# Patient Record
Sex: Female | Born: 1935 | Race: White | Hispanic: No | Marital: Married | Smoking: Never smoker
Health system: Southern US, Community
[De-identification: ages and names within clinical notes are randomized; demographics above are authoritative.]

## PROBLEM LIST (undated history)

## (undated) DIAGNOSIS — T7840XA Allergy, unspecified, initial encounter: Secondary | ICD-10-CM

## (undated) DIAGNOSIS — I10 Essential (primary) hypertension: Secondary | ICD-10-CM

## (undated) HISTORY — DX: Allergy, unspecified, initial encounter: T78.40XA

## (undated) HISTORY — DX: Essential (primary) hypertension: I10

---

## 1968-11-15 HISTORY — PX: CLOSED REDUCTION AND PERCUTANEOUS PINNING OF HUMERUS FRACTURE: SHX1356

## 2005-04-29 ENCOUNTER — Ambulatory Visit: Payer: Self-pay | Admitting: Family Medicine

## 2005-05-04 ENCOUNTER — Ambulatory Visit: Payer: Self-pay | Admitting: Family Medicine

## 2006-06-03 ENCOUNTER — Ambulatory Visit: Payer: Self-pay | Admitting: Family Medicine

## 2007-06-15 ENCOUNTER — Ambulatory Visit: Payer: Self-pay | Admitting: Family Medicine

## 2008-07-03 ENCOUNTER — Ambulatory Visit: Payer: Self-pay | Admitting: Family Medicine

## 2009-07-08 ENCOUNTER — Ambulatory Visit: Payer: Self-pay | Admitting: Anesthesiology

## 2010-07-09 ENCOUNTER — Ambulatory Visit: Payer: Self-pay | Admitting: Anesthesiology

## 2011-08-04 ENCOUNTER — Ambulatory Visit: Payer: Self-pay | Admitting: Anesthesiology

## 2012-02-11 ENCOUNTER — Emergency Department: Payer: Self-pay | Admitting: Emergency Medicine

## 2012-02-11 LAB — COMPREHENSIVE METABOLIC PANEL
Albumin: 3.8 g/dL (ref 3.4–5.0)
Alkaline Phosphatase: 65 U/L (ref 50–136)
Bilirubin,Total: 0.5 mg/dL (ref 0.2–1.0)
Calcium, Total: 8.8 mg/dL (ref 8.5–10.1)
Co2: 24 mmol/L (ref 21–32)
Creatinine: 0.6 mg/dL (ref 0.60–1.30)
Glucose: 102 mg/dL — ABNORMAL HIGH (ref 65–99)
SGPT (ALT): 41 U/L
Total Protein: 7.3 g/dL (ref 6.4–8.2)

## 2012-02-11 LAB — CBC
HCT: 40.8 % (ref 35.0–47.0)
HGB: 13.2 g/dL (ref 12.0–16.0)
MCHC: 32.5 g/dL (ref 32.0–36.0)
MCV: 84 fL (ref 80–100)
RBC: 4.86 10*6/uL (ref 3.80–5.20)
RDW: 14.8 % — ABNORMAL HIGH (ref 11.5–14.5)
WBC: 6.7 10*3/uL (ref 3.6–11.0)

## 2012-03-27 ENCOUNTER — Ambulatory Visit: Payer: Self-pay | Admitting: Family Medicine

## 2012-08-04 ENCOUNTER — Ambulatory Visit: Payer: Self-pay

## 2013-08-08 ENCOUNTER — Ambulatory Visit: Payer: Self-pay | Admitting: Family Medicine

## 2013-10-31 ENCOUNTER — Ambulatory Visit: Payer: Self-pay | Admitting: Family Medicine

## 2014-02-01 ENCOUNTER — Ambulatory Visit: Payer: Self-pay | Admitting: Family Medicine

## 2014-05-23 ENCOUNTER — Ambulatory Visit: Payer: Self-pay | Admitting: Family Medicine

## 2014-08-23 ENCOUNTER — Ambulatory Visit: Payer: Self-pay | Admitting: Family Medicine

## 2014-11-22 DIAGNOSIS — T8389XD Other specified complication of genitourinary prosthetic devices, implants and grafts, subsequent encounter: Secondary | ICD-10-CM | POA: Diagnosis not present

## 2014-11-22 DIAGNOSIS — N8111 Cystocele, midline: Secondary | ICD-10-CM | POA: Diagnosis not present

## 2014-11-22 DIAGNOSIS — N898 Other specified noninflammatory disorders of vagina: Secondary | ICD-10-CM | POA: Diagnosis not present

## 2014-12-24 DIAGNOSIS — N390 Urinary tract infection, site not specified: Secondary | ICD-10-CM | POA: Diagnosis not present

## 2014-12-24 DIAGNOSIS — N8111 Cystocele, midline: Secondary | ICD-10-CM | POA: Diagnosis not present

## 2014-12-24 DIAGNOSIS — Z79899 Other long term (current) drug therapy: Secondary | ICD-10-CM | POA: Diagnosis not present

## 2014-12-24 DIAGNOSIS — N8112 Cystocele, lateral: Secondary | ICD-10-CM | POA: Diagnosis not present

## 2014-12-24 DIAGNOSIS — N3946 Mixed incontinence: Secondary | ICD-10-CM | POA: Diagnosis not present

## 2014-12-31 DIAGNOSIS — N3946 Mixed incontinence: Secondary | ICD-10-CM | POA: Diagnosis not present

## 2015-01-03 DIAGNOSIS — N812 Incomplete uterovaginal prolapse: Secondary | ICD-10-CM | POA: Diagnosis not present

## 2015-01-03 DIAGNOSIS — R339 Retention of urine, unspecified: Secondary | ICD-10-CM | POA: Diagnosis not present

## 2015-01-07 ENCOUNTER — Ambulatory Visit: Payer: Self-pay | Admitting: Family Medicine

## 2015-01-07 DIAGNOSIS — Z01818 Encounter for other preprocedural examination: Secondary | ICD-10-CM | POA: Diagnosis not present

## 2015-01-07 DIAGNOSIS — J40 Bronchitis, not specified as acute or chronic: Secondary | ICD-10-CM | POA: Diagnosis not present

## 2015-01-07 DIAGNOSIS — I10 Essential (primary) hypertension: Secondary | ICD-10-CM | POA: Diagnosis not present

## 2015-02-04 DIAGNOSIS — I10 Essential (primary) hypertension: Secondary | ICD-10-CM | POA: Diagnosis not present

## 2015-02-28 DIAGNOSIS — M1990 Unspecified osteoarthritis, unspecified site: Secondary | ICD-10-CM | POA: Diagnosis not present

## 2015-02-28 DIAGNOSIS — I10 Essential (primary) hypertension: Secondary | ICD-10-CM | POA: Diagnosis not present

## 2015-02-28 DIAGNOSIS — N3281 Overactive bladder: Secondary | ICD-10-CM | POA: Diagnosis not present

## 2015-02-28 DIAGNOSIS — N819 Female genital prolapse, unspecified: Secondary | ICD-10-CM | POA: Diagnosis not present

## 2015-02-28 DIAGNOSIS — N393 Stress incontinence (female) (male): Secondary | ICD-10-CM | POA: Diagnosis not present

## 2015-02-28 DIAGNOSIS — E785 Hyperlipidemia, unspecified: Secondary | ICD-10-CM | POA: Diagnosis not present

## 2015-03-10 DIAGNOSIS — M1990 Unspecified osteoarthritis, unspecified site: Secondary | ICD-10-CM | POA: Diagnosis not present

## 2015-03-10 DIAGNOSIS — Z79899 Other long term (current) drug therapy: Secondary | ICD-10-CM | POA: Diagnosis not present

## 2015-03-10 DIAGNOSIS — N3946 Mixed incontinence: Secondary | ICD-10-CM | POA: Diagnosis not present

## 2015-03-10 DIAGNOSIS — Z7982 Long term (current) use of aspirin: Secondary | ICD-10-CM | POA: Diagnosis not present

## 2015-03-10 DIAGNOSIS — N814 Uterovaginal prolapse, unspecified: Secondary | ICD-10-CM | POA: Diagnosis not present

## 2015-03-10 DIAGNOSIS — N8110 Cystocele, unspecified: Secondary | ICD-10-CM | POA: Insufficient documentation

## 2015-03-10 DIAGNOSIS — E785 Hyperlipidemia, unspecified: Secondary | ICD-10-CM | POA: Diagnosis not present

## 2015-03-10 DIAGNOSIS — I10 Essential (primary) hypertension: Secondary | ICD-10-CM | POA: Diagnosis not present

## 2015-03-10 DIAGNOSIS — N812 Incomplete uterovaginal prolapse: Secondary | ICD-10-CM | POA: Diagnosis not present

## 2015-03-11 DIAGNOSIS — Z7982 Long term (current) use of aspirin: Secondary | ICD-10-CM | POA: Diagnosis not present

## 2015-03-11 DIAGNOSIS — N3946 Mixed incontinence: Secondary | ICD-10-CM | POA: Diagnosis not present

## 2015-03-11 DIAGNOSIS — N814 Uterovaginal prolapse, unspecified: Secondary | ICD-10-CM | POA: Diagnosis not present

## 2015-03-11 DIAGNOSIS — I10 Essential (primary) hypertension: Secondary | ICD-10-CM | POA: Diagnosis not present

## 2015-03-11 DIAGNOSIS — Z79899 Other long term (current) drug therapy: Secondary | ICD-10-CM | POA: Diagnosis not present

## 2015-03-11 DIAGNOSIS — M1990 Unspecified osteoarthritis, unspecified site: Secondary | ICD-10-CM | POA: Diagnosis not present

## 2015-03-11 DIAGNOSIS — E785 Hyperlipidemia, unspecified: Secondary | ICD-10-CM | POA: Diagnosis not present

## 2015-04-02 DIAGNOSIS — R5383 Other fatigue: Secondary | ICD-10-CM | POA: Diagnosis not present

## 2015-07-01 ENCOUNTER — Ambulatory Visit (INDEPENDENT_AMBULATORY_CARE_PROVIDER_SITE_OTHER): Payer: Medicare Other | Admitting: Family Medicine

## 2015-07-01 ENCOUNTER — Encounter: Payer: Self-pay | Admitting: Family Medicine

## 2015-07-01 VITALS — BP 140/77 | HR 65 | Temp 97.5°F | Resp 18 | Ht 59.0 in | Wt 170.1 lb

## 2015-07-01 DIAGNOSIS — I10 Essential (primary) hypertension: Secondary | ICD-10-CM

## 2015-07-01 DIAGNOSIS — R32 Unspecified urinary incontinence: Secondary | ICD-10-CM | POA: Insufficient documentation

## 2015-07-01 DIAGNOSIS — E785 Hyperlipidemia, unspecified: Secondary | ICD-10-CM | POA: Diagnosis not present

## 2015-07-01 MED ORDER — ATENOLOL 100 MG PO TABS: 100.0000 mg | ORAL_TABLET | Freq: Every day | ORAL | Status: DC

## 2015-07-01 MED ORDER — HYDROCHLOROTHIAZIDE 25 MG PO TABS: 25.0000 mg | ORAL_TABLET | Freq: Every day | ORAL | Status: DC

## 2015-07-01 NOTE — Progress Notes (Signed)
Name: Amanda Jacobs   MRN: [id number]    DOB: 09-23-1931   Date:07/01/2015       Progress Note  Subjective  Chief Complaint  Chief Complaint  Patient presents with  . Follow-up    3 mo./ possible kidney infection    Hypertension This is a chronic problem. The problem is controlled. Pertinent negatives include no blurred vision, chest pain, headaches, palpitations or shortness of breath. Past treatments include beta blockers, calcium channel blockers and diuretics. There is no history of kidney disease, CAD/MI or CVA.    Past Medical History  Diagnosis Date  . Allergy   . Hypertension     Past Surgical History  Procedure Laterality Date  . Closed reduction and percutaneous pinning of humerus fracture  1970    hip    Family History  Problem Relation Age of Onset  . Hypertension Sister   . Diabetes Sister   . Hypertension Brother   . Heart disease Brother   . Cancer Maternal Aunt     colon  . Diabetes Maternal Grandmother     Social History   Social History  . Marital Status: Married    Spouse Name: N/A  . Number of Children: N/A  . Years of Education: N/A   Occupational History  . Not on file.   Social History Main Topics  . Smoking status: Never Smoker   . Smokeless tobacco: Never Used  . Alcohol Use: No  . Drug Use: No  . Sexual Activity: No   Other Topics Concern  . Not on file   Social History Narrative  . No narrative on file     Current outpatient prescriptions:  .  amLODipine (NORVASC) 10 MG tablet, , Disp: , Rfl:  .  aspirin EC 81 MG tablet, Take 81 mg by mouth., Disp: , Rfl:  .  atenolol (TENORMIN) 100 MG tablet, , Disp: , Rfl:  .  Calcium Carb-Cholecalciferol (CALTRATE 600+D) 600-800 MG-UNIT TABS, Take by mouth., Disp: , Rfl:  .  conjugated estrogens (PREMARIN) vaginal cream, Frequency:PHARMDIR   Dosage:0.0     Instructions:  Note:twice weekly (Sunday and Wednesday) Dose: 0.625MG /G, Disp: , Rfl:  .  cyclobenzaprine (FLEXERIL) 5 MG  tablet, Take 5 mg by mouth., Disp: , Rfl:  .  docusate sodium (COLACE) 100 MG capsule, Take 100 mg by mouth., Disp: , Rfl:  .  hydrochlorothiazide (HYDRODIURIL) 25 MG tablet, , Disp: , Rfl:  .  meloxicam (MOBIC) 7.5 MG tablet, , Disp: , Rfl:  .  Omega-3 Fatty Acids (FISH OIL CONCENTRATE) 300 MG CAPS, Take by mouth., Disp: , Rfl:  .  oxybutynin (DITROPAN-XL) 10 MG 24 hr tablet, , Disp: , Rfl:  .  oxyCODONE-acetaminophen (PERCOCET/ROXICET) 5-325 MG per tablet, Take by mouth., Disp: , Rfl:  .  pyridOXINE (VITAMIN B-6) 50 MG tablet, Take 50 mg by mouth., Disp: , Rfl:   Allergies  Allergen Reactions  . Ace Inhibitors     Angioedema  . Azor  [Amlodipine-Olmesartan] Swelling  . Benazepril Cough     Review of Systems  Eyes: Negative for blurred vision.  Respiratory: Negative for shortness of breath.   Cardiovascular: Negative for chest pain and palpitations.  Neurological: Negative for headaches.      Objective  Filed Vitals:   07/01/15 1119  BP: 140/77  Pulse: 65  Temp: 97.5 F (36.4 C)  TempSrc: Oral  Resp: 18  Height: 4\' 11"  (1.499 m)  Weight: 170 lb 1.6 oz (77.157 kg)  SpO2:  93%    Physical Exam  Constitutional: She is well-developed, well-nourished, and in no distress.  Cardiovascular: Normal rate and regular rhythm.   Pulmonary/Chest: Effort normal and breath sounds normal.  Nursing note and vitals reviewed.    Assessment & Plan  1. Essential hypertension  - atenolol (TENORMIN) 100 MG tablet; Take 1 tablet (100 mg total) by mouth daily.  Dispense: 90 tablet; Refill: 0 - hydrochlorothiazide (HYDRODIURIL) 25 MG tablet; Take 1 tablet (25 mg total) by mouth daily.  Dispense: 90 tablet; Refill: 0  2. Dyslipidemia  - Lipid Profile   Drishti Pepperman [name] 07/01/2015 11:34 AM

## 2015-07-02 LAB — LIPID PANEL
Chol/HDL Ratio: 3.8 ratio units (ref 0.0–4.4)
Cholesterol, Total: 168 mg/dL (ref 100–199)
HDL: 44 mg/dL (ref >39)
LDL CALC: 108 mg/dL — AB (ref 0–99)
Triglycerides: 78 mg/dL (ref 0–149)
VLDL CHOLESTEROL CAL: 16 mg/dL (ref 5–40)

## 2015-08-07 ENCOUNTER — Ambulatory Visit (INDEPENDENT_AMBULATORY_CARE_PROVIDER_SITE_OTHER): Payer: Medicare Other

## 2015-08-07 DIAGNOSIS — Z23 Encounter for immunization: Secondary | ICD-10-CM | POA: Diagnosis not present

## 2015-09-09 ENCOUNTER — Other Ambulatory Visit: Payer: Self-pay

## 2015-09-09 MED ORDER — AMLODIPINE BESYLATE 10 MG PO TABS: 10.0000 mg | ORAL_TABLET | Freq: Every day | ORAL | Status: DC

## 2015-09-09 NOTE — Telephone Encounter (Signed)
Patient requesting refill. 

## 2015-10-07 ENCOUNTER — Other Ambulatory Visit: Payer: Self-pay | Admitting: Family Medicine

## 2015-10-07 DIAGNOSIS — M1710 Unilateral primary osteoarthritis, unspecified knee: Secondary | ICD-10-CM

## 2015-10-07 DIAGNOSIS — I10 Essential (primary) hypertension: Secondary | ICD-10-CM

## 2015-10-07 NOTE — Telephone Encounter (Signed)
Error, chart opened by error

## 2016-01-06 ENCOUNTER — Other Ambulatory Visit: Payer: Self-pay | Admitting: Family Medicine

## 2016-01-06 ENCOUNTER — Telehealth: Payer: Self-pay | Admitting: Family Medicine

## 2016-01-06 DIAGNOSIS — I10 Essential (primary) hypertension: Secondary | ICD-10-CM

## 2016-01-06 MED ORDER — AMLODIPINE BESYLATE 10 MG PO TABS: 10.0000 mg | ORAL_TABLET | Freq: Every day | ORAL | Status: DC

## 2016-01-06 NOTE — Telephone Encounter (Signed)
Medication has been refilled and sent to [facility] °

## 2016-01-06 NOTE — Telephone Encounter (Signed)
HCTZ- BLOOD PRESSURE  ATENOLOL- BLOOD PRESSURE The pt is out of the medications per the pharmacy. Said that they have sent a request by fax also.

## 2016-01-15 ENCOUNTER — Ambulatory Visit: Payer: Medicare Other | Admitting: Family Medicine

## 2016-01-21 ENCOUNTER — Ambulatory Visit (INDEPENDENT_AMBULATORY_CARE_PROVIDER_SITE_OTHER): Payer: Medicare Other | Admitting: Family Medicine

## 2016-01-21 ENCOUNTER — Encounter: Payer: Self-pay | Admitting: Family Medicine

## 2016-01-21 VITALS — BP 128/72 | HR 60 | Temp 97.9°F | Resp 15 | Ht 59.0 in | Wt 162.2 lb

## 2016-01-21 DIAGNOSIS — I10 Essential (primary) hypertension: Secondary | ICD-10-CM

## 2016-01-21 MED ORDER — HYDROCHLOROTHIAZIDE 25 MG PO TABS: 25.0000 mg | ORAL_TABLET | Freq: Every day | ORAL | Status: DC

## 2016-01-21 MED ORDER — AMLODIPINE BESYLATE 10 MG PO TABS: 10.0000 mg | ORAL_TABLET | Freq: Every day | ORAL | Status: DC

## 2016-01-21 MED ORDER — ATENOLOL 100 MG PO TABS: 100.0000 mg | ORAL_TABLET | Freq: Every day | ORAL | Status: DC

## 2016-01-21 NOTE — Progress Notes (Signed)
Name: Amanda Jacobs   MRN: [id number]    DOB: Jun 10, 1931   Date:01/21/2016       Progress Note  Subjective  Chief Complaint  Chief Complaint  Patient presents with  . Medication Refill    Hypertension This is a chronic problem. The problem is controlled. Pertinent negatives include no chest pain, headaches, palpitations or shortness of breath. Past treatments include calcium channel blockers, diuretics and beta blockers. There is no history of kidney disease, CAD/MI or CVA.    Past Medical History  Diagnosis Date  . Allergy   . Hypertension     Past Surgical History  Procedure Laterality Date  . Closed reduction and percutaneous pinning of humerus fracture  1970    hip    Family History  Problem Relation Age of Onset  . Hypertension Sister   . Diabetes Sister   . Hypertension Brother   . Heart disease Brother   . Cancer Maternal Aunt     colon  . Diabetes Maternal Grandmother     Social History   Social History  . Marital Status: Married    Spouse Name: N/A  . Number of Children: N/A  . Years of Education: N/A   Occupational History  . Not on file.   Social History Main Topics  . Smoking status: Never Smoker   . Smokeless tobacco: Never Used  . Alcohol Use: No  . Drug Use: No  . Sexual Activity: No   Other Topics Concern  . Not on file   Social History Narrative     Current outpatient prescriptions:  .  amLODipine (NORVASC) 10 MG tablet, Take 1 tablet (10 mg total) by mouth daily., Disp: 30 tablet, Rfl: 0 .  aspirin EC 81 MG tablet, Take 81 mg by mouth., Disp: , Rfl:  .  atenolol (TENORMIN) 100 MG tablet, Take 1 tablet (100 mg total) by mouth daily., Disp: 30 tablet, Rfl: 0 .  Calcium Carb-Cholecalciferol (CALTRATE 600+D) 600-800 MG-UNIT TABS, Take by mouth., Disp: , Rfl:  .  conjugated estrogens (PREMARIN) vaginal cream, Frequency:PHARMDIR   Dosage:0.0     Instructions:  Note:twice weekly (Sunday and Wednesday) Dose: 0.625MG /G, Disp: , Rfl:  .   cyclobenzaprine (FLEXERIL) 5 MG tablet, Take 5 mg by mouth., Disp: , Rfl:  .  docusate sodium (COLACE) 100 MG capsule, Take 100 mg by mouth., Disp: , Rfl:  .  hydrochlorothiazide (HYDRODIURIL) 25 MG tablet, Take 1 tablet (25 mg total) by mouth daily., Disp: 30 tablet, Rfl: 0 .  meloxicam (MOBIC) 7.5 MG tablet, TAKE ONE TABLET BY MOUTH TWICE DAILY WITH FOOD., Disp: 180 tablet, Rfl: 2 .  Omega-3 Fatty Acids (FISH OIL CONCENTRATE) 300 MG CAPS, Take by mouth., Disp: , Rfl:  .  oxybutynin (DITROPAN-XL) 10 MG 24 hr tablet, , Disp: , Rfl:  .  oxyCODONE-acetaminophen (PERCOCET/ROXICET) 5-325 MG per tablet, Take by mouth., Disp: , Rfl:  .  pyridOXINE (VITAMIN B-6) 50 MG tablet, Take 50 mg by mouth., Disp: , Rfl:   Allergies  Allergen Reactions  . Ace Inhibitors     Angioedema  . Azor  [Amlodipine-Olmesartan] Swelling  . Benazepril Cough     Review of Systems  Respiratory: Negative for shortness of breath.   Cardiovascular: Negative for chest pain and palpitations.  Neurological: Negative for headaches.    Objective  Filed Vitals:   01/21/16 1333  BP: 128/72  Pulse: 60  Temp: 97.9 F (36.6 C)  TempSrc: Oral  Resp: 15  Height: 4'  11" (1.499 m)  Weight: 162 lb 3.2 oz (73.573 kg)  SpO2: 97%    Physical Exam  Constitutional: She is oriented to person, place, and time and well-developed, well-nourished, and in no distress.  HENT:  Head: Normocephalic and atraumatic.  Cardiovascular: Normal rate and regular rhythm.   Pulmonary/Chest: Effort normal and breath sounds normal.  Musculoskeletal:       Right ankle: She exhibits no swelling. Tenderness.       Left ankle: She exhibits no swelling. Tenderness.  Neurological: She is alert and oriented to person, place, and time.  Nursing note and vitals reviewed.      Assessment & Plan  1. Essential hypertension BP stable and controlled on present therapy. - amLODipine (NORVASC) 10 MG tablet; Take 1 tablet (10 mg total) by mouth  daily.  Dispense: 30 tablet; Refill: 5 - atenolol (TENORMIN) 100 MG tablet; Take 1 tablet (100 mg total) by mouth daily.  Dispense: 30 tablet; Refill: 5 - hydrochlorothiazide (HYDRODIURIL) 25 MG tablet; Take 1 tablet (25 mg total) by mouth daily.  Dispense: 30 tablet; Refill: 5   Amanda Jacobs [name] 01/21/2016 1:43 PM

## 2016-07-08 ENCOUNTER — Other Ambulatory Visit: Payer: Self-pay | Admitting: Family Medicine

## 2016-07-08 DIAGNOSIS — I10 Essential (primary) hypertension: Secondary | ICD-10-CM

## 2016-07-29 ENCOUNTER — Ambulatory Visit (INDEPENDENT_AMBULATORY_CARE_PROVIDER_SITE_OTHER): Payer: Medicare Other

## 2016-07-29 DIAGNOSIS — Z23 Encounter for immunization: Secondary | ICD-10-CM

## 2016-08-06 ENCOUNTER — Other Ambulatory Visit: Payer: Self-pay | Admitting: Family Medicine

## 2016-08-06 DIAGNOSIS — I10 Essential (primary) hypertension: Secondary | ICD-10-CM

## 2016-08-10 ENCOUNTER — Encounter: Payer: Self-pay | Admitting: Family Medicine

## 2016-08-10 ENCOUNTER — Ambulatory Visit (INDEPENDENT_AMBULATORY_CARE_PROVIDER_SITE_OTHER): Payer: Medicare Other | Admitting: Family Medicine

## 2016-08-10 VITALS — BP 136/71 | HR 113 | Temp 97.6°F | Resp 17 | Wt 150.6 lb

## 2016-08-10 DIAGNOSIS — I10 Essential (primary) hypertension: Secondary | ICD-10-CM

## 2016-08-10 DIAGNOSIS — E785 Hyperlipidemia, unspecified: Secondary | ICD-10-CM

## 2016-08-10 MED ORDER — AMLODIPINE BESYLATE 10 MG PO TABS: 10.0000 mg | ORAL_TABLET | Freq: Every day | ORAL | 5 refills | Status: DC

## 2016-08-10 MED ORDER — ATENOLOL 100 MG PO TABS
100.0000 mg | ORAL_TABLET | Freq: Every day | ORAL | 5 refills | Status: DC
Start: 2016-08-10 — End: 2017-08-23

## 2016-08-10 MED ORDER — HYDROCHLOROTHIAZIDE 25 MG PO TABS: 25.0000 mg | ORAL_TABLET | Freq: Every day | ORAL | 5 refills | Status: DC

## 2016-08-10 NOTE — Progress Notes (Signed)
Name: Amanda Jacobs   MRN: [id number]    DOB: 1931/09/29   Date:08/10/2016       Progress Note  Subjective  Chief Complaint  Chief Complaint  Patient presents with  . Medication Refill    Hypertension  This is a chronic problem. The problem is unchanged. The problem is controlled. Pertinent negatives include no blurred vision, chest pain, headaches, palpitations or shortness of breath. Past treatments include beta blockers, calcium channel blockers and diuretics. There is no history of kidney disease, CAD/MI or CVA.  Hyperlipidemia  This is a chronic problem. The problem is uncontrolled. Recent lipid tests were reviewed and are high. Pertinent negatives include no chest pain or shortness of breath. She is currently on no antihyperlipidemic treatment.    Past Medical History:  Diagnosis Date  . Allergy   . Hypertension     Past Surgical History:  Procedure Laterality Date  . CLOSED REDUCTION AND PERCUTANEOUS PINNING OF HUMERUS FRACTURE  1970   hip    Family History  Problem Relation Age of Onset  . Hypertension Sister   . Diabetes Sister   . Hypertension Brother   . Heart disease Brother   . Cancer Maternal Aunt     colon  . Diabetes Maternal Grandmother     Social History   Social History  . Marital status: Married    Spouse name: N/A  . Number of children: N/A  . Years of education: N/A   Occupational History  . Not on file.   Social History Main Topics  . Smoking status: Never Smoker  . Smokeless tobacco: Never Used  . Alcohol use No  . Drug use: No  . Sexual activity: No   Other Topics Concern  . Not on file   Social History Narrative  . No narrative on file     Current Outpatient Prescriptions:  .  amLODipine (NORVASC) 10 MG tablet, Take 1 tablet (10 mg total) by mouth daily., Disp: 30 tablet, Rfl: 5 .  aspirin EC 81 MG tablet, Take 81 mg by mouth., Disp: , Rfl:  .  atenolol (TENORMIN) 100 MG tablet, Take 1 tablet (100 mg total) by mouth  daily., Disp: 30 tablet, Rfl: 5 .  Calcium Carb-Cholecalciferol (CALTRATE 600+D) 600-800 MG-UNIT TABS, Take by mouth., Disp: , Rfl:  .  conjugated estrogens (PREMARIN) vaginal cream, Frequency:PHARMDIR   Dosage:0.0     Instructions:  Note:twice weekly (Sunday and Wednesday) Dose: 0.625MG /G, Disp: , Rfl:  .  cyclobenzaprine (FLEXERIL) 5 MG tablet, Take 5 mg by mouth., Disp: , Rfl:  .  hydrochlorothiazide (HYDRODIURIL) 25 MG tablet, Take 1 tablet (25 mg total) by mouth daily., Disp: 30 tablet, Rfl: 5 .  meloxicam (MOBIC) 7.5 MG tablet, TAKE ONE TABLET BY MOUTH TWICE DAILY WITH FOOD., Disp: 180 tablet, Rfl: 2 .  Omega-3 Fatty Acids (FISH OIL CONCENTRATE) 300 MG CAPS, Take by mouth., Disp: , Rfl:  .  oxybutynin (DITROPAN-XL) 10 MG 24 hr tablet, , Disp: , Rfl:  .  oxyCODONE-acetaminophen (PERCOCET/ROXICET) 5-325 MG per tablet, Take by mouth., Disp: , Rfl:  .  pyridOXINE (VITAMIN B-6) 50 MG tablet, Take 50 mg by mouth., Disp: , Rfl:   Allergies  Allergen Reactions  . Ace Inhibitors     Angioedema  . Azor  [Amlodipine-Olmesartan] Swelling  . Benazepril Cough     Review of Systems  Eyes: Negative for blurred vision.  Respiratory: Negative for shortness of breath.   Cardiovascular: Negative for chest pain and palpitations.  Neurological: Negative for headaches.    Objective  Vitals:   08/10/16 1400  BP: 136/71  Pulse: (!) 113  Resp: 17  Temp: 97.6 F (36.4 C)  TempSrc: Oral  SpO2: 93%  Weight: 150 lb 9.6 oz (68.3 kg)    Physical Exam  Constitutional: She is well-developed, well-nourished, and in no distress.  HENT:  Head: Normocephalic and atraumatic.  Cardiovascular: Normal rate, regular rhythm, S1 normal and S2 normal.   No murmur heard. Pulmonary/Chest: Effort normal and breath sounds normal. She has no wheezes.  Musculoskeletal:       Right ankle: She exhibits no swelling.       Left ankle: She exhibits no swelling.  Psychiatric: Mood, memory, affect and judgment normal.   Nursing note and vitals reviewed.      Assessment & Plan  1. Essential hypertension BP stable and controlled on present antihypertensive therapy - amLODipine (NORVASC) 10 MG tablet; Take 1 tablet (10 mg total) by mouth daily.  Dispense: 30 tablet; Refill: 5 - atenolol (TENORMIN) 100 MG tablet; Take 1 tablet (100 mg total) by mouth daily.  Dispense: 30 tablet; Refill: 5 - hydrochlorothiazide (HYDRODIURIL) 25 MG tablet; Take 1 tablet (25 mg total) by mouth daily.  Dispense: 30 tablet; Refill: 5  2. Dyslipidemia  - Lipid Profile   Trenden Hazelrigg [facility]ad [facility] 08/10/2016 2:14 PM

## 2016-08-11 ENCOUNTER — Ambulatory Visit: Payer: Medicare Other | Admitting: Family Medicine

## 2016-08-30 ENCOUNTER — Telehealth: Payer: Self-pay | Admitting: Family Medicine

## 2016-08-30 NOTE — Telephone Encounter (Signed)
Called Pt to schedule AWV with [facility] for 10/30 - knb

## 2016-12-30 ENCOUNTER — Telehealth: Payer: Self-pay | Admitting: Family Medicine

## 2016-12-30 ENCOUNTER — Other Ambulatory Visit: Payer: Self-pay | Admitting: Family Medicine

## 2016-12-30 DIAGNOSIS — I10 Essential (primary) hypertension: Secondary | ICD-10-CM

## 2016-12-30 DIAGNOSIS — M1710 Unilateral primary osteoarthritis, unspecified knee: Secondary | ICD-10-CM

## 2016-12-30 MED ORDER — HYDROCHLOROTHIAZIDE 25 MG PO TABS: 25.0000 mg | ORAL_TABLET | Freq: Every day | ORAL | 0 refills | Status: DC

## 2016-12-30 NOTE — Telephone Encounter (Signed)
Prescription for hydrochlorothiazide has been sent to patient's pharmacy

## 2016-12-30 NOTE — Telephone Encounter (Signed)
Pt completely out of hydrochlorothiazide 25mg . [facility]

## 2016-12-31 NOTE — Telephone Encounter (Signed)
Pt informed

## 2017-01-31 ENCOUNTER — Ambulatory Visit: Payer: Medicare Other | Admitting: Family Medicine

## 2017-01-31 ENCOUNTER — Ambulatory Visit (INDEPENDENT_AMBULATORY_CARE_PROVIDER_SITE_OTHER): Payer: Medicare Other

## 2017-01-31 VITALS — BP 132/62 | HR 80 | Temp 97.2°F | Ht 59.0 in | Wt 144.8 lb

## 2017-01-31 DIAGNOSIS — Z0000 Encounter for general adult medical examination without abnormal findings: Secondary | ICD-10-CM

## 2017-01-31 DIAGNOSIS — Z1211 Encounter for screening for malignant neoplasm of colon: Secondary | ICD-10-CM

## 2017-01-31 DIAGNOSIS — Z1239 Encounter for other screening for malignant neoplasm of breast: Secondary | ICD-10-CM | POA: Insufficient documentation

## 2017-01-31 DIAGNOSIS — Z780 Asymptomatic menopausal state: Secondary | ICD-10-CM | POA: Insufficient documentation
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
## 2017-01-31 NOTE — Patient Instructions (Signed)
Amanda Jacobs , Thank you for taking time to come for your Medicare Wellness Visit. I appreciate your ongoing commitment to your health goals. Please review the following plan we discussed and let me know if I can assist you in the future.   Screening recommendations/referrals: Colonoscopy- N/A Mammogram- N/A Bone Density- Denied referral Recommended yearly ophthalmology/optometry visit for glaucoma screening and checkup Recommended yearly dental visit for hygiene and checkup  Vaccinations: Influenza vaccine- done 07/29/16 Pneumococcal vaccine- only have record for Pneumovax 23, Prevnar not present. Pt to check with pharmacy about receiving this.  Tdap vaccine- done 09/02/14   Advanced directives: Declined   Next appointment: None   Preventive Care 65 Years and Older, Female Preventive care refers to lifestyle choices and visits with your health care provider that can promote health and wellness. What does preventive care include?  A yearly physical exam. This is also called an annual well check.  Dental exams once or twice a year.  Routine eye exams. Ask your health care provider how often you should have your eyes checked.  Personal lifestyle choices, including:  Daily care of your teeth and gums.  Regular physical activity.  Eating a healthy diet.  Avoiding tobacco and drug use.  Limiting alcohol use.  Practicing safe sex.  Taking low-dose aspirin every day.  Taking vitamin and mineral supplements as recommended by your health care provider. What happens during an annual well check? The services and screenings done by your health care provider during your annual well check will depend on your age, overall health, lifestyle risk factors, and family history of disease. Counseling  Your health care provider may ask you questions about your:  Alcohol use.  Tobacco use.  Drug use.  Emotional well-being.  Home and relationship well-being.  Sexual  activity.  Eating habits.  History of falls.  Memory and ability to understand (cognition).  Work and work Astronomerenvironment.  Reproductive health. Screening  You may have the following tests or measurements:  Height, weight, and BMI.  Blood pressure.  Lipid and cholesterol levels. These may be checked every 5 years, or more frequently if you are over 81 years old.  Skin check.  Lung cancer screening. You may have this screening every year starting at age 81 if you have a 30-pack-year history of smoking and currently smoke or have quit within the past 15 years.  Fecal occult blood test (FOBT) of the stool. You may have this test every year starting at age 81.  Flexible sigmoidoscopy or colonoscopy. You may have a sigmoidoscopy every 5 years or a colonoscopy every 10 years starting at age 81.  Hepatitis C blood test.  Hepatitis B blood test.  Sexually transmitted disease (STD) testing.  Diabetes screening. This is done by checking your blood sugar (glucose) after you have not eaten for a while (fasting). You may have this done every 1-3 years.  Bone density scan. This is done to screen for osteoporosis. You may have this done starting at age 81.  Mammogram. This may be done every 1-2 years. Talk to your health care provider about how often you should have regular mammograms. Talk with your health care provider about your test results, treatment options, and if necessary, the need for more tests. Vaccines  Your health care provider may recommend certain vaccines, such as:  Influenza vaccine. This is recommended every year.  Tetanus, diphtheria, and acellular pertussis (Tdap, Td) vaccine. You may need a Td booster every 10 years.  Zoster vaccine. You  may need this after age 85.  Pneumococcal 13-valent conjugate (PCV13) vaccine. One dose is recommended after age 46.  Pneumococcal polysaccharide (PPSV23) vaccine. One dose is recommended after age 94. Talk to your health care  provider about which screenings and vaccines you need and how often you need them. This information is not intended to replace advice given to you by your health care provider. Make sure you discuss any questions you have with your health care provider. Document Released: 11/28/2015 Document Revised: 07/21/2016 Document Reviewed: 09/02/2015 Elsevier Interactive Patient Education  2017 [employer] Prevention in the Home Falls can cause injuries. They can happen to people of all ages. There are many things you can do to make your home safe and to help prevent falls. What can I do on the outside of my home?  Regularly fix the edges of walkways and driveways and fix any cracks.  Remove anything that might make you trip as you walk through a door, such as a raised step or threshold.  Trim any bushes or trees on the path to your home.  Use bright outdoor lighting.  Clear any walking paths of anything that might make someone trip, such as rocks or tools.  Regularly check to see if handrails are loose or broken. Make sure that both sides of any steps have handrails.  Any raised decks and porches should have guardrails on the edges.  Have any leaves, snow, or ice cleared regularly.  Use sand or salt on walking paths during winter.  Clean up any spills in your garage right away. This includes oil or grease spills. What can I do in the bathroom?  Use night lights.  Install grab bars by the toilet and in the tub and shower. Do not use towel bars as grab bars.  Use non-skid mats or decals in the tub or shower.  If you need to sit down in the shower, use a plastic, non-slip stool.  Keep the floor dry. Clean up any water that spills on the floor as soon as it happens.  Remove soap buildup in the tub or shower regularly.  Attach bath mats securely with double-sided non-slip rug tape.  Do not have throw rugs and other things on the floor that can make you trip. What can I do in  the bedroom?  Use night lights.  Make sure that you have a light by your bed that is easy to reach.  Do not use any sheets or blankets that are too big for your bed. They should not hang down onto the floor.  Have a firm chair that has side arms. You can use this for support while you get dressed.  Do not have throw rugs and other things on the floor that can make you trip. What can I do in the kitchen?  Clean up any spills right away.  Avoid walking on wet floors.  Keep items that you use a lot in easy-to-reach places.  If you need to reach something above you, use a strong step stool that has a grab bar.  Keep electrical cords out of the way.  Do not use floor polish or wax that makes floors slippery. If you must use wax, use non-skid floor wax.  Do not have throw rugs and other things on the floor that can make you trip. What can I do with my stairs?  Do not leave any items on the stairs.  Make sure that there are handrails on both  sides of the stairs and use them. Fix handrails that are broken or loose. Make sure that handrails are as long as the stairways.  Check any carpeting to make sure that it is firmly attached to the stairs. Fix any carpet that is loose or worn.  Avoid having throw rugs at the top or bottom of the stairs. If you do have throw rugs, attach them to the floor with carpet tape.  Make sure that you have a light switch at the top of the stairs and the bottom of the stairs. If you do not have them, ask someone to add them for you. What else can I do to help prevent falls?  Wear shoes that:  Do not have high heels.  Have rubber bottoms.  Are comfortable and fit you well.  Are closed at the toe. Do not wear sandals.  If you use a stepladder:  Make sure that it is fully opened. Do not climb a closed stepladder.  Make sure that both sides of the stepladder are locked into place.  Ask someone to hold it for you, if possible.  Clearly mark and  make sure that you can see:  Any grab bars or handrails.  First and last steps.  Where the edge of each step is.  Use tools that help you move around (mobility aids) if they are needed. These include:  Canes.  Walkers.  Scooters.  Crutches.  Turn on the lights when you go into a dark area. Replace any light bulbs as soon as they burn out.  Set up your furniture so you have a clear path. Avoid moving your furniture around.  If any of your floors are uneven, fix them.  If there are any pets around you, be aware of where they are.  Review your medicines with your doctor. Some medicines can make you feel dizzy. This can increase your chance of falling. Ask your doctor what other things that you can do to help prevent falls. This information is not intended to replace advice given to you by your health care provider. Make sure you discuss any questions you have with your health care provider. Document Released: 08/28/2009 Document Revised: 04/08/2016 Document Reviewed: 12/06/2014 Elsevier Interactive Patient Education  2017 [employer].

## 2017-01-31 NOTE — Progress Notes (Signed)
Subjective:   Amanda Jacobs is a 83 y.o. female who presents for Medicare Annual (Subsequent) preventive examination.  Review of Systems:  N/A  Cardiac Risk Factors include: advanced age (>28men, >67 women);dyslipidemia;hypertension     Objective:     Vitals: BP 132/62 (BP Location: Left Arm)   Pulse 80   Temp 97.2 F (36.2 C) (Oral)   Ht 4\' 11"  (1.499 m)   Wt 144 lb 12.8 oz (65.7 kg)   BMI 29.25 kg/m   Body mass index is 29.25 kg/m.   Tobacco History  Smoking Status  . Never Smoker  Smokeless Tobacco  . Never Used     Counseling given: Not Answered   Past Medical History:  Diagnosis Date  . Allergy   . Hypertension    Past Surgical History:  Procedure Laterality Date  . CLOSED REDUCTION AND PERCUTANEOUS PINNING OF HUMERUS FRACTURE  1970   hip   Family History  Problem Relation Age of Onset  . Hypertension Sister   . Diabetes Sister   . Hypertension Brother   . Heart disease Brother   . Cancer Maternal Aunt     colon  . Diabetes Maternal Grandmother    History  Sexual Activity  . Sexual activity: No    Outpatient Encounter Prescriptions as of 01/31/2017  Medication Sig  . amLODipine (NORVASC) 10 MG tablet Take 1 tablet (10 mg total) by mouth daily.  Marland Kitchen aspirin EC 81 MG tablet Take 81 mg by mouth.  Marland Kitchen atenolol (TENORMIN) 100 MG tablet Take 1 tablet (100 mg total) by mouth daily.  . Calcium Carb-Cholecalciferol (CALTRATE 600+D) 600-800 MG-UNIT TABS Take by mouth.  . conjugated estrogens (PREMARIN) vaginal cream Frequency:PHARMDIR   Dosage:0.0     Instructions:  Note:twice weekly (Sunday and Wednesday) Dose: 0.625MG /G  . cyclobenzaprine (FLEXERIL) 5 MG tablet Take 5 mg by mouth at bedtime.   . hydrochlorothiazide (HYDRODIURIL) 25 MG tablet Take 1 tablet (25 mg total) by mouth daily.  . meloxicam (MOBIC) 7.5 MG tablet TAKE ONE TABLET BY MOUTH TWICE DAILY WITH FOOD.  Marland Kitchen Omega-3 Fatty Acids (FISH OIL CONCENTRATE) 300 MG CAPS Take by mouth.  .  oxybutynin (DITROPAN-XL) 10 MG 24 hr tablet   . oxyCODONE-acetaminophen (PERCOCET/ROXICET) 5-325 MG per tablet Take 1 tablet by mouth.   . pyridOXINE (VITAMIN B-6) 50 MG tablet Take 50 mg by mouth.   No facility-administered encounter medications on file as of 01/31/2017.     Activities of Daily Living In your present state of health, do you have any difficulty performing the following activities: 01/31/2017 08/10/2016  Hearing? N N  Vision? N Y  Difficulty concentrating or making decisions? N N  Walking or climbing stairs? Y N  Dressing or bathing? N N  Doing errands, shopping? Y N  Preparing Food and eating ? N -  Using the Toilet? N -  In the past six months, have you accidently leaked urine? Y -  Do you have problems with loss of bowel control? N -  Managing your Medications? N -  Managing your Finances? N -  Housekeeping or managing your Housekeeping? N -  Some recent data might be hidden    Patient Care Team: Ellyn Hack, MD as PCP - General (Family Medicine)    Assessment:     Exercise Activities and Dietary recommendations Current Exercise Habits: The patient does not participate in regular exercise at present, Exercise limited by: orthopedic condition(s)  Goals    . Increase  water intake          Recommend increasing water intake to 4 glasses a day.      Fall Risk Fall Risk  01/31/2017 08/10/2016 01/21/2016  Falls in the past year? No No No   Depression Screen PHQ 2/9 Scores 01/31/2017 08/10/2016 01/21/2016  PHQ - 2 Score 0 0 0     Cognitive Function     6CIT Screen 01/31/2017  What Year? 4 points  What month? 3 points  What time? 0 points  Count back from 20 0 points  Months in reverse 4 points  Repeat phrase 8 points  Total Score 19    Immunization History  Administered Date(s) Administered  . Influenza, High Dose Seasonal PF 08/07/2015, 07/29/2016  . Influenza, Seasonal, Injecte, Preservative Fre 07/24/2012  . Influenza,inj,Quad PF,36+ Mos  08/31/2013, 07/25/2014  . Pneumococcal Polysaccharide-23 07/24/2012   Screening Tests Health Maintenance  Topic Date Due  . PNA vac Low Risk Adult (2 of 2 - PCV13) 01/13/2018 (Originally 07/24/2013)  . DEXA SCAN  11/15/2026 (Originally 01/06/1998)  . TETANUS/TDAP  09/02/2024  . INFLUENZA VACCINE  Completed      Plan:  I have personally reviewed and addressed the Medicare Annual Wellness questionnaire and have noted the following in the patient's chart:  A. Medical and social history B. Use of alcohol, tobacco or illicit drugs  C. Current medications and supplements D. Functional ability and status E.  Nutritional status F.  Physical activity G. Advance directives H. List of other physicians I.  Hospitalizations, surgeries, and ER visits in previous 12 months J.  Vitals K. Screenings such as hearing and vision if needed, cognitive and depression L. Referrals and appointments - none  In addition, I have reviewed and discussed with patient certain preventive protocols, quality metrics, and best practice recommendations. A written personalized care plan for preventive services as well as general preventive health recommendations were provided to patient.  See attached scanned questionnaire for additional information.   Signed,  Hyacinth MeekerMckenzie Audrey Thull, LPN Nurse Health Advisor   MD Recommendations: None. Pt declined DEXA scan and denied Prevnar 13 vaccine due to wanting to check previous records first.   I, as supervising physician, have reviewed the nurse health advisor's Medicare Wellness Visit note for this patient and concur with the findings and recommendations listed above.  Signed Syed Asad A. Sherryll BurgerShah MD Attending Physician.

## 2017-02-05 ENCOUNTER — Other Ambulatory Visit: Payer: Self-pay | Admitting: Family Medicine

## 2017-02-05 DIAGNOSIS — M1710 Unilateral primary osteoarthritis, unspecified knee: Secondary | ICD-10-CM

## 2017-02-07 ENCOUNTER — Ambulatory Visit: Payer: Medicare Other | Admitting: Family Medicine

## 2017-03-21 ENCOUNTER — Other Ambulatory Visit: Payer: Self-pay | Admitting: Family Medicine

## 2017-03-21 DIAGNOSIS — M1710 Unilateral primary osteoarthritis, unspecified knee: Secondary | ICD-10-CM

## 2017-03-22 ENCOUNTER — Other Ambulatory Visit: Payer: Self-pay | Admitting: Family Medicine

## 2017-03-22 DIAGNOSIS — M1710 Unilateral primary osteoarthritis, unspecified knee: Secondary | ICD-10-CM

## 2017-04-12 ENCOUNTER — Other Ambulatory Visit: Payer: Self-pay | Admitting: Family Medicine

## 2017-04-12 DIAGNOSIS — I10 Essential (primary) hypertension: Secondary | ICD-10-CM

## 2017-08-22 ENCOUNTER — Other Ambulatory Visit: Payer: Self-pay | Admitting: Family Medicine

## 2017-08-22 DIAGNOSIS — M1710 Unilateral primary osteoarthritis, unspecified knee: Secondary | ICD-10-CM

## 2017-08-22 DIAGNOSIS — I10 Essential (primary) hypertension: Secondary | ICD-10-CM

## 2017-08-23 ENCOUNTER — Ambulatory Visit (INDEPENDENT_AMBULATORY_CARE_PROVIDER_SITE_OTHER): Payer: Medicare Other | Admitting: Family Medicine

## 2017-08-23 ENCOUNTER — Ambulatory Visit
Admission: RE | Admit: 2017-08-23 | Discharge: 2017-08-23 | Disposition: A | Discharge status: Home / Self Care | Payer: Medicare Other | Source: Ambulatory Visit | Attending: Family Medicine | Admitting: Family Medicine

## 2017-08-23 DIAGNOSIS — G8929 Other chronic pain: Secondary | ICD-10-CM | POA: Diagnosis not present

## 2017-08-23 DIAGNOSIS — M8580 Other specified disorders of bone density and structure, unspecified site: Secondary | ICD-10-CM | POA: Diagnosis not present

## 2017-08-23 DIAGNOSIS — M25561 Pain in right knee: Secondary | ICD-10-CM | POA: Diagnosis not present

## 2017-08-23 DIAGNOSIS — R262 Difficulty in walking, not elsewhere classified: Secondary | ICD-10-CM | POA: Diagnosis not present

## 2017-08-23 DIAGNOSIS — M25761 Osteophyte, right knee: Secondary | ICD-10-CM | POA: Diagnosis not present

## 2017-08-23 DIAGNOSIS — I10 Essential (primary) hypertension: Secondary | ICD-10-CM | POA: Diagnosis not present

## 2017-08-23 DIAGNOSIS — M1711 Unilateral primary osteoarthritis, right knee: Secondary | ICD-10-CM | POA: Diagnosis not present

## 2017-08-23 DIAGNOSIS — Z23 Encounter for immunization: Secondary | ICD-10-CM

## 2017-08-23 DIAGNOSIS — R413 Other amnesia: Secondary | ICD-10-CM | POA: Diagnosis not present

## 2017-08-23 MED ORDER — ATENOLOL 100 MG PO TABS
100.0000 mg | ORAL_TABLET | Freq: Every day | ORAL | 5 refills | Status: AC
Start: 2017-08-23 — End: ?

## 2017-08-23 MED ORDER — AMLODIPINE BESYLATE 10 MG PO TABS: 10.0000 mg | ORAL_TABLET | Freq: Every day | ORAL | 5 refills | Status: AC

## 2017-08-23 MED ORDER — HYDROCHLOROTHIAZIDE 25 MG PO TABS: 25.0000 mg | ORAL_TABLET | Freq: Every day | ORAL | 0 refills | Status: AC

## 2017-08-23 NOTE — Progress Notes (Signed)
Name: Amanda Jacobs   MRN: [id number]    DOB: 06/08/1931   Date:08/24/2017       Progress Note  Subjective  Chief Complaint  Chief Complaint  Patient presents with  . Hypertension    follow up  . Difficulty Walking    Hypertension  This is a chronic problem. The problem is unchanged. The problem is controlled. Pertinent negatives include no blurred vision, chest pain, headaches or palpitations.   Patient presents with her sister who reports that she had her gait is different because of pain in her right leg, she has to walk with the cane, patient reports she is able to walk moderately well, is able to do her housework but reports significant pain in the right knee which restricts her ambulation.  Patient's sister also reports that patient has difficulty remembering small details, she is still able to do her housework but her sister is concerned that she may be developing dementia with inability to remember recent events.  Past Medical History:  Diagnosis Date  . Allergy   . Hypertension     Past Surgical History:  Procedure Laterality Date  . CLOSED REDUCTION AND PERCUTANEOUS PINNING OF HUMERUS FRACTURE  1970   hip    Family History  Problem Relation Age of Onset  . Hypertension Sister   . Diabetes Sister   . Hypertension Brother   . Heart disease Brother   . Cancer Maternal Aunt        colon  . Diabetes Maternal Grandmother     Social History   Social History  . Marital status: Married    Spouse name: N/A  . Number of children: N/A  . Years of education: N/A   Occupational History  . Not on file.   Social History Main Topics  . Smoking status: Never Smoker  . Smokeless tobacco: Never Used  . Alcohol use No  . Drug use: No  . Sexual activity: No   Other Topics Concern  . Not on file   Social History Narrative  . No narrative on file     Current Outpatient Prescriptions:  .  amLODipine (NORVASC) 10 MG tablet, Take 1 tablet (10 mg total) by mouth  daily., Disp: 30 tablet, Rfl: 5 .  aspirin EC 81 MG tablet, Take 81 mg by mouth., Disp: , Rfl:  .  atenolol (TENORMIN) 100 MG tablet, Take 1 tablet (100 mg total) by mouth daily., Disp: 30 tablet, Rfl: 5 .  Calcium Carb-Cholecalciferol (CALTRATE 600+D) 600-800 MG-UNIT TABS, Take by mouth., Disp: , Rfl:  .  conjugated estrogens (PREMARIN) vaginal cream, Frequency:PHARMDIR   Dosage:0.0     Instructions:  Note:twice weekly (Sunday and Wednesday) Dose: 0.625MG /G, Disp: , Rfl:  .  cyclobenzaprine (FLEXERIL) 5 MG tablet, Take 5 mg by mouth at bedtime. , Disp: , Rfl:  .  hydrochlorothiazide (HYDRODIURIL) 25 MG tablet, Take 1 tablet (25 mg total) by mouth daily., Disp: 90 tablet, Rfl: 0 .  meloxicam (MOBIC) 7.5 MG tablet, TAKE 1 TABLET TWICE A DAY WITH FOOD, Disp: 180 tablet, Rfl: 0 .  Omega-3 Fatty Acids (FISH OIL CONCENTRATE) 300 MG CAPS, Take by mouth., Disp: , Rfl:  .  oxybutynin (DITROPAN-XL) 10 MG 24 hr tablet, , Disp: , Rfl:  .  pyridOXINE (VITAMIN B-6) 50 MG tablet, Take 50 mg by mouth., Disp: , Rfl:  .  oxyCODONE-acetaminophen (PERCOCET/ROXICET) 5-325 MG per tablet, Take 1 tablet by mouth. , Disp: , Rfl:   Allergies  Allergen Reactions  .  Ace Inhibitors     Angioedema  . Azor  [Amlodipine-Olmesartan] Swelling  . Benazepril Cough     Review of Systems  Eyes: Negative for blurred vision.  Cardiovascular: Negative for chest pain and palpitations.  Neurological: Negative for headaches.      Objective  There were no vitals filed for this visit.  Physical Exam  Constitutional: She is oriented to person, place, and time and well-developed, well-nourished, and in no distress.  Cardiovascular: Normal rate, regular rhythm and normal heart sounds.   No murmur heard. Pulmonary/Chest: Effort normal and breath sounds normal. She has no wheezes.  Musculoskeletal:       Right knee: She exhibits swelling. Tenderness found. Medial joint line tenderness noted.  Neurological: She is alert and  oriented to person, place, and time.  Nursing note and vitals reviewed.    Assessment & Plan  1. Need for influenza vaccination  - Flu vaccine HIGH DOSE PF (Fluzone High dose)  2. Chronic pain of right knee Suspect osteoarthritis which would explain her abnormal gait, obtain x-rays and follow-up - DG Knee Complete 4 Views Right; Future  3. Impaired ambulation   4. Memory impairment Patient will return for a complete memory testing for evaluation of possible dementia  5. Essential hypertension  - hydrochlorothiazide (HYDRODIURIL) 25 MG tablet; Take 1 tablet (25 mg total) by mouth daily.  Dispense: 90 tablet; Refill: 0 - amLODipine (NORVASC) 10 MG tablet; Take 1 tablet (10 mg total) by mouth daily.  Dispense: 30 tablet; Refill: 5 - atenolol (TENORMIN) 100 MG tablet; Take 1 tablet (100 mg total) by mouth daily.  Dispense: 30 tablet; Refill: 5  6. Pneumococcal vaccination given  - Pneumococcal conjugate vaccine 13-valent IM  Albino Bufford [name] 08/24/2017 6:46 PM

## 2017-08-26 ENCOUNTER — Telehealth: Payer: Self-pay

## 2017-08-26 NOTE — Telephone Encounter (Signed)
Called pt informed her of the information below. Pt gave verbal understanding.  

## 2017-08-26 NOTE — Telephone Encounter (Signed)
-----   Message from Ellyn Hack, MD sent at 08/24/2017  6:21 PM EDT ----- X-ray of right knee shows severe osteoarthritic joint space loss at the medial compartment with bone-on-bone appearance, mild to moderate degenerative changes of the lateral and patellofemoral compartments. She is on meloxicam which should be continued but should also be referred to orthopedics for further management of osteoarthritis of the knee

## 2017-08-30 ENCOUNTER — Other Ambulatory Visit: Payer: Self-pay | Admitting: Family Medicine

## 2017-08-30 ENCOUNTER — Encounter: Payer: Self-pay | Admitting: Family Medicine

## 2017-08-30 ENCOUNTER — Ambulatory Visit (INDEPENDENT_AMBULATORY_CARE_PROVIDER_SITE_OTHER): Payer: Medicare Other | Admitting: Family Medicine

## 2017-08-30 VITALS — BP 134/58 | HR 55 | Temp 97.5°F | Resp 16 | Ht 59.0 in | Wt 125.4 lb

## 2017-08-30 DIAGNOSIS — G301 Alzheimer's disease with late onset: Secondary | ICD-10-CM | POA: Diagnosis not present

## 2017-08-30 DIAGNOSIS — F0280 Dementia in other diseases classified elsewhere without behavioral disturbance: Secondary | ICD-10-CM

## 2017-08-30 DIAGNOSIS — Z780 Asymptomatic menopausal state: Secondary | ICD-10-CM | POA: Diagnosis not present

## 2017-08-30 DIAGNOSIS — I10 Essential (primary) hypertension: Secondary | ICD-10-CM | POA: Diagnosis not present

## 2017-08-30 DIAGNOSIS — R413 Other amnesia: Secondary | ICD-10-CM | POA: Diagnosis not present

## 2017-08-30 DIAGNOSIS — M1710 Unilateral primary osteoarthritis, unspecified knee: Secondary | ICD-10-CM

## 2017-08-30 MED ORDER — DONEPEZIL HCL 5 MG PO TABS: 5.0000 mg | ORAL_TABLET | Freq: Every day | ORAL | 0 refills | Status: DC

## 2017-08-30 NOTE — Progress Notes (Signed)
Name: Amanda Jacobs   MRN: [id number]    DOB: 06/30/1931   Date:08/30/2017       Progress Note  Subjective  Chief Complaint  Chief Complaint  Patient presents with  . Follow-up    1 week for a memory test   . Eating Disorder    Pt not eating. Pt lost 20 lbs since march     HPI  Pt. presents for evaluation of memory impairment, symptoms include difficulty remembering recent details, decline in functional status (including eating, taking her medication), she has stopped changing her clothes as frequently a sshe used to. Her daughter and sister are concerned about the decline in her status. Has lost 20 lbs in last 5-6 months.    Past Medical History:  Diagnosis Date  . Allergy   . Hypertension     Past Surgical History:  Procedure Laterality Date  . CLOSED REDUCTION AND PERCUTANEOUS PINNING OF HUMERUS FRACTURE  1970   hip    Family History  Problem Relation Age of Onset  . Hypertension Sister   . Diabetes Sister   . Hypertension Brother   . Heart disease Brother   . Cancer Maternal Aunt        colon  . Diabetes Maternal Grandmother     Social History   Social History  . Marital status: Married    Spouse name: N/A  . Number of children: N/A  . Years of education: N/A   Occupational History  . Not on file.   Social History Main Topics  . Smoking status: Never Smoker  . Smokeless tobacco: Never Used  . Alcohol use No  . Drug use: No  . Sexual activity: No   Other Topics Concern  . Not on file   Social History Narrative  . No narrative on file     Current Outpatient Prescriptions:  .  amLODipine (NORVASC) 10 MG tablet, Take 1 tablet (10 mg total) by mouth daily., Disp: 30 tablet, Rfl: 5 .  atenolol (TENORMIN) 100 MG tablet, Take 1 tablet (100 mg total) by mouth daily., Disp: 30 tablet, Rfl: 5 .  hydrochlorothiazide (HYDRODIURIL) 25 MG tablet, Take 1 tablet (25 mg total) by mouth daily., Disp: 90 tablet, Rfl: 0 .  meloxicam (MOBIC) 7.5 MG tablet,  TAKE 1 TABLET TWICE A DAY WITH FOOD, Disp: 180 tablet, Rfl: 0 .  pyridOXINE (VITAMIN B-6) 50 MG tablet, Take 50 mg by mouth., Disp: , Rfl:  .  aspirin EC 81 MG tablet, Take 81 mg by mouth., Disp: , Rfl:  .  Calcium Carb-Cholecalciferol (CALTRATE 600+D) 600-800 MG-UNIT TABS, Take by mouth., Disp: , Rfl:  .  conjugated estrogens (PREMARIN) vaginal cream, Frequency:PHARMDIR   Dosage:0.0     Instructions:  Note:twice weekly (Sunday and Wednesday) Dose: 0.625MG /G, Disp: , Rfl:  .  cyclobenzaprine (FLEXERIL) 5 MG tablet, Take 5 mg by mouth at bedtime. , Disp: , Rfl:  .  Omega-3 Fatty Acids (FISH OIL CONCENTRATE) 300 MG CAPS, Take by mouth., Disp: , Rfl:  .  oxybutynin (DITROPAN-XL) 10 MG 24 hr tablet, , Disp: , Rfl:  .  oxyCODONE-acetaminophen (PERCOCET/ROXICET) 5-325 MG per tablet, Take 1 tablet by mouth. , Disp: , Rfl:   Allergies  Allergen Reactions  . Ace Inhibitors     Angioedema  . Azor  [Amlodipine-Olmesartan] Swelling  . Benazepril Cough    Review of Systems  Unable to perform ROS: Mental acuity    Objective  Vitals:   08/30/17 1348  BP: Marland Kitchen)  134/58  Pulse: (!) 55  Resp: 16  Temp: (!) 97.5 F (36.4 C)  TempSrc: Oral  SpO2: 96%  Weight: 125 lb 6.4 oz (56.9 kg)  Height:  (1.499 m)    Physical Exam  Constitutional: She is well-developed, well-nourished, and in no distress.  HENT:  Head: Normocephalic and atraumatic.  Neurological: She is alert.  Psychiatric: Mood and affect normal. She exhibits abnormal recent memory.  Nursing note and vitals reviewed.    Assessment & Plan  1. Late onset Alzheimer's disease without behavioral disturbance 6-CIT score is consistent with Alzheimer's dementia, obtain lab work including TSH, vitamin D, B12 and folate to rule out other causes of memory impairment. Referral to neurology for further management and home health for assistance with ADLs. - donepezil (ARICEPT) 5 MG tablet; Take 1 tablet (5 mg total) by mouth at bedtime.   Dispense: 42 tablet; Refill: 0 - CBC with Differential/Platelet - COMPLETE METABOLIC PANEL WITH GFR - TSH - VITAMIN D 25 Hydroxy (Vit-D Deficiency, Fractures) - B12 - Folate - Ambulatory referral to Neurology - Ambulatory referral to Home Health   Khang Hannum [name] 08/30/2017 1:56 PM

## 2017-08-31 LAB — CBC WITH DIFFERENTIAL/PLATELET
BASOS ABS: 64 {cells}/uL (ref 0–200)
Basophils Relative: 0.8 %
Eosinophils Absolute: 104 cells/uL (ref 15–500)
Eosinophils Relative: 1.3 %
HEMATOCRIT: 42.8 % (ref 35.0–45.0)
Hemoglobin: 14.2 g/dL (ref 11.7–15.5)
Lymphs Abs: 1736 cells/uL (ref 850–3900)
MCH: 30.3 pg (ref 27.0–33.0)
MCHC: 33.2 g/dL (ref 32.0–36.0)
MCV: 91.3 fL (ref 80.0–100.0)
MPV: 11 fL (ref 7.5–12.5)
Monocytes Relative: 8.5 %
NEUTROS PCT: 67.7 %
Neutro Abs: 5416 cells/uL (ref 1500–7800)
PLATELETS: 270 10*3/uL (ref 140–400)
RBC: 4.69 10*6/uL (ref 3.80–5.10)
RDW: 12.7 % (ref 11.0–15.0)
TOTAL LYMPHOCYTE: 21.7 %
WBC: 8 10*3/uL (ref 3.8–10.8)
WBCMIX: 680 {cells}/uL (ref 200–950)

## 2017-08-31 LAB — FOLATE: FOLATE: 10.9 ng/mL

## 2017-08-31 LAB — COMPLETE METABOLIC PANEL WITH GFR
AG RATIO: 1.6 (calc) (ref 1.0–2.5)
ALKALINE PHOSPHATASE (APISO): 55 U/L (ref 33–130)
ALT: 13 U/L (ref 6–29)
AST: 22 U/L (ref 10–35)
Albumin: 4.3 g/dL (ref 3.6–5.1)
BILIRUBIN TOTAL: 0.9 mg/dL (ref 0.2–1.2)
BUN/Creatinine Ratio: 32 (calc) — ABNORMAL HIGH (ref 6–22)
BUN: 26 mg/dL — ABNORMAL HIGH (ref 7–25)
CALCIUM: 10 mg/dL (ref 8.6–10.4)
CHLORIDE: 98 mmol/L (ref 98–110)
CO2: 30 mmol/L (ref 20–32)
Creat: 0.81 mg/dL (ref 0.60–0.88)
GFR, Est African American: 77 mL/min/{1.73_m2} (ref >=60)
GFR, Est Non African American: 67 mL/min/{1.73_m2} (ref >=60)
GLOBULIN: 2.7 g/dL (ref 1.9–3.7)
Glucose, Bld: 89 mg/dL (ref 65–139)
POTASSIUM: 3.9 mmol/L (ref 3.5–5.3)
SODIUM: 139 mmol/L (ref 135–146)
Total Protein: 7 g/dL (ref 6.1–8.1)

## 2017-08-31 LAB — VITAMIN D 25 HYDROXY (VIT D DEFICIENCY, FRACTURES): Vit D, 25-Hydroxy: 32 ng/mL (ref 30–100)

## 2017-08-31 LAB — TSH: TSH: 1.16 m[IU]/L (ref 0.40–4.50)

## 2017-08-31 LAB — VITAMIN B12: Vitamin B-12: 240 pg/mL (ref 200–1100)

## 2017-09-01 ENCOUNTER — Telehealth: Payer: Self-pay

## 2017-09-01 DIAGNOSIS — R262 Difficulty in walking, not elsewhere classified: Secondary | ICD-10-CM

## 2017-09-01 NOTE — Telephone Encounter (Signed)
Please place a new referral for physical therapy with the diagnosis of "impaired ambulation"and document in patient's chart.

## 2017-09-01 NOTE — Telephone Encounter (Signed)
Amanda AgarChristie Ray from [facility] stated that when they went to this patient's house to do the assessment for Nursing and they found that they are looking for someone to sit with them and do home chores/ run errand.   They also came to the conclusion that she would actually benefit more from physical therapy to help with her balance and gait.   Will it be ok to place a new referral?

## 2017-09-02 NOTE — Telephone Encounter (Signed)
Completed.

## 2017-09-02 NOTE — Telephone Encounter (Signed)
Order for new referral to physical therapy is signed and is in patient's chart

## 2017-09-05 DIAGNOSIS — M6281 Muscle weakness (generalized): Secondary | ICD-10-CM | POA: Diagnosis not present

## 2017-09-05 DIAGNOSIS — E785 Hyperlipidemia, unspecified: Secondary | ICD-10-CM | POA: Diagnosis not present

## 2017-09-05 DIAGNOSIS — R262 Difficulty in walking, not elsewhere classified: Secondary | ICD-10-CM | POA: Diagnosis not present

## 2017-09-05 DIAGNOSIS — I10 Essential (primary) hypertension: Secondary | ICD-10-CM | POA: Diagnosis not present

## 2017-09-07 ENCOUNTER — Telehealth: Payer: Self-pay | Admitting: Family Medicine

## 2017-09-07 DIAGNOSIS — E785 Hyperlipidemia, unspecified: Secondary | ICD-10-CM | POA: Diagnosis not present

## 2017-09-07 DIAGNOSIS — R262 Difficulty in walking, not elsewhere classified: Secondary | ICD-10-CM | POA: Diagnosis not present

## 2017-09-07 DIAGNOSIS — M6281 Muscle weakness (generalized): Secondary | ICD-10-CM | POA: Diagnosis not present

## 2017-09-07 DIAGNOSIS — I10 Essential (primary) hypertension: Secondary | ICD-10-CM | POA: Diagnosis not present

## 2017-09-07 NOTE — Telephone Encounter (Signed)
Mel, Patients PT would like a call back at [phone]

## 2017-09-09 NOTE — Telephone Encounter (Signed)
Spoke to mel and he is concern about her me's. Some of the med's that's on her list is not in her medication box at home. I suggest Monday when he see the pt to call me and we should go over her medication. He agreed.

## 2017-09-12 DIAGNOSIS — I10 Essential (primary) hypertension: Secondary | ICD-10-CM | POA: Diagnosis not present

## 2017-09-12 DIAGNOSIS — M1711 Unilateral primary osteoarthritis, right knee: Secondary | ICD-10-CM | POA: Diagnosis not present

## 2017-09-12 DIAGNOSIS — N8110 Cystocele, unspecified: Secondary | ICD-10-CM | POA: Diagnosis not present

## 2017-09-12 DIAGNOSIS — G301 Alzheimer's disease with late onset: Secondary | ICD-10-CM | POA: Diagnosis not present

## 2017-09-12 NOTE — Progress Notes (Signed)
Patient has not called back in to the [facility] 

## 2017-09-15 DIAGNOSIS — M1711 Unilateral primary osteoarthritis, right knee: Secondary | ICD-10-CM | POA: Diagnosis not present

## 2017-09-15 DIAGNOSIS — N8110 Cystocele, unspecified: Secondary | ICD-10-CM | POA: Diagnosis not present

## 2017-09-15 DIAGNOSIS — I10 Essential (primary) hypertension: Secondary | ICD-10-CM | POA: Diagnosis not present

## 2017-09-15 DIAGNOSIS — G301 Alzheimer's disease with late onset: Secondary | ICD-10-CM | POA: Diagnosis not present

## 2017-09-19 DIAGNOSIS — M1711 Unilateral primary osteoarthritis, right knee: Secondary | ICD-10-CM | POA: Diagnosis not present

## 2017-09-19 DIAGNOSIS — N8110 Cystocele, unspecified: Secondary | ICD-10-CM | POA: Diagnosis not present

## 2017-09-19 DIAGNOSIS — I10 Essential (primary) hypertension: Secondary | ICD-10-CM | POA: Diagnosis not present

## 2017-09-19 DIAGNOSIS — G301 Alzheimer's disease with late onset: Secondary | ICD-10-CM | POA: Diagnosis not present

## 2017-09-21 DIAGNOSIS — I10 Essential (primary) hypertension: Secondary | ICD-10-CM | POA: Diagnosis not present

## 2017-09-21 DIAGNOSIS — N8110 Cystocele, unspecified: Secondary | ICD-10-CM | POA: Diagnosis not present

## 2017-09-21 DIAGNOSIS — M1711 Unilateral primary osteoarthritis, right knee: Secondary | ICD-10-CM | POA: Diagnosis not present

## 2017-09-21 DIAGNOSIS — G301 Alzheimer's disease with late onset: Secondary | ICD-10-CM | POA: Diagnosis not present

## 2017-09-22 DIAGNOSIS — N8110 Cystocele, unspecified: Secondary | ICD-10-CM | POA: Diagnosis not present

## 2017-09-22 DIAGNOSIS — I10 Essential (primary) hypertension: Secondary | ICD-10-CM | POA: Diagnosis not present

## 2017-09-22 DIAGNOSIS — G301 Alzheimer's disease with late onset: Secondary | ICD-10-CM | POA: Diagnosis not present

## 2017-09-22 DIAGNOSIS — M1711 Unilateral primary osteoarthritis, right knee: Secondary | ICD-10-CM | POA: Diagnosis not present

## 2017-09-26 DIAGNOSIS — N8110 Cystocele, unspecified: Secondary | ICD-10-CM | POA: Diagnosis not present

## 2017-09-26 DIAGNOSIS — G301 Alzheimer's disease with late onset: Secondary | ICD-10-CM | POA: Diagnosis not present

## 2017-09-26 DIAGNOSIS — I10 Essential (primary) hypertension: Secondary | ICD-10-CM | POA: Diagnosis not present

## 2017-09-26 DIAGNOSIS — M1711 Unilateral primary osteoarthritis, right knee: Secondary | ICD-10-CM | POA: Diagnosis not present

## 2017-09-28 DIAGNOSIS — G301 Alzheimer's disease with late onset: Secondary | ICD-10-CM | POA: Diagnosis not present

## 2017-09-28 DIAGNOSIS — M1711 Unilateral primary osteoarthritis, right knee: Secondary | ICD-10-CM | POA: Diagnosis not present

## 2017-09-28 DIAGNOSIS — I10 Essential (primary) hypertension: Secondary | ICD-10-CM | POA: Diagnosis not present

## 2017-09-28 DIAGNOSIS — N8110 Cystocele, unspecified: Secondary | ICD-10-CM | POA: Diagnosis not present

## 2017-09-29 DIAGNOSIS — G301 Alzheimer's disease with late onset: Secondary | ICD-10-CM | POA: Diagnosis not present

## 2017-09-29 DIAGNOSIS — G912 (Idiopathic) normal pressure hydrocephalus: Secondary | ICD-10-CM | POA: Diagnosis not present

## 2017-09-30 ENCOUNTER — Other Ambulatory Visit: Payer: Self-pay | Admitting: Neurology

## 2017-09-30 DIAGNOSIS — M1711 Unilateral primary osteoarthritis, right knee: Secondary | ICD-10-CM | POA: Diagnosis not present

## 2017-09-30 DIAGNOSIS — N8110 Cystocele, unspecified: Secondary | ICD-10-CM | POA: Diagnosis not present

## 2017-09-30 DIAGNOSIS — I10 Essential (primary) hypertension: Secondary | ICD-10-CM | POA: Diagnosis not present

## 2017-09-30 DIAGNOSIS — G301 Alzheimer's disease with late onset: Secondary | ICD-10-CM | POA: Diagnosis not present

## 2017-09-30 DIAGNOSIS — G912 (Idiopathic) normal pressure hydrocephalus: Secondary | ICD-10-CM

## 2017-10-04 DIAGNOSIS — G301 Alzheimer's disease with late onset: Secondary | ICD-10-CM | POA: Diagnosis not present

## 2017-10-04 DIAGNOSIS — I10 Essential (primary) hypertension: Secondary | ICD-10-CM | POA: Diagnosis not present

## 2017-10-04 DIAGNOSIS — N8110 Cystocele, unspecified: Secondary | ICD-10-CM | POA: Diagnosis not present

## 2017-10-04 DIAGNOSIS — M1711 Unilateral primary osteoarthritis, right knee: Secondary | ICD-10-CM | POA: Diagnosis not present

## 2017-10-07 ENCOUNTER — Ambulatory Visit
Admission: RE | Admit: 2017-10-07 | Discharge: 2017-10-07 | Disposition: A | Discharge status: Home / Self Care | Payer: Medicare Other | Source: Ambulatory Visit | Attending: Neurology | Admitting: Neurology

## 2017-10-07 DIAGNOSIS — G319 Degenerative disease of nervous system, unspecified: Secondary | ICD-10-CM | POA: Diagnosis not present

## 2017-10-07 DIAGNOSIS — R413 Other amnesia: Secondary | ICD-10-CM | POA: Diagnosis not present

## 2017-10-07 DIAGNOSIS — R269 Unspecified abnormalities of gait and mobility: Secondary | ICD-10-CM | POA: Diagnosis not present

## 2017-10-07 DIAGNOSIS — R9082 White matter disease, unspecified: Secondary | ICD-10-CM | POA: Diagnosis not present

## 2017-10-07 DIAGNOSIS — G912 (Idiopathic) normal pressure hydrocephalus: Secondary | ICD-10-CM | POA: Diagnosis not present

## 2017-10-11 ENCOUNTER — Ambulatory Visit (INDEPENDENT_AMBULATORY_CARE_PROVIDER_SITE_OTHER): Payer: Medicare Other | Admitting: Family Medicine

## 2017-10-11 ENCOUNTER — Encounter: Payer: Self-pay | Admitting: Family Medicine

## 2017-10-11 VITALS — BP 112/48 | HR 52 | Temp 97.6°F | Resp 14 | Wt 125.1 lb

## 2017-10-11 DIAGNOSIS — I10 Essential (primary) hypertension: Secondary | ICD-10-CM | POA: Diagnosis not present

## 2017-10-11 DIAGNOSIS — G301 Alzheimer's disease with late onset: Secondary | ICD-10-CM

## 2017-10-11 DIAGNOSIS — R0600 Dyspnea, unspecified: Secondary | ICD-10-CM | POA: Diagnosis not present

## 2017-10-11 DIAGNOSIS — F0280 Dementia in other diseases classified elsewhere without behavioral disturbance: Secondary | ICD-10-CM | POA: Diagnosis not present

## 2017-10-11 NOTE — Progress Notes (Signed)
Name: Amanda Jacobs   MRN: [id number]    DOB: [birth year]   Date:10/11/2017       Progress Note  Subjective  Chief Complaint  Chief Complaint  Patient presents with  . CT scan    Results  . medication mangement    Dr. Sherryll Burger neuro doctor asking to take off amplodipine   . Shortness of Breath    HPI  Patient presents for follow-up from neurologist. She was seen by me 4 weeks ago and was diagnosed with Alzheimer's dementia, has osteoarthritis of the right knee, poor mobility, she has been started on Aricept, a CT scan ordered by the neurologist has rule out normal pressure hydrocephalus. Patient presents with her sister, who reports that she is doing reasonably well, we have discussed adding a second agent for Alzheimer's dementia to patient's regimen. She also reports intermittent right knee pain, is not interested in getting an injection in the joint but does report meloxicam helps with the pain. Physical therapy referral has been placed   Past Medical History:  Diagnosis Date  . Allergy   . Hypertension     Past Surgical History:  Procedure Laterality Date  . CLOSED REDUCTION AND PERCUTANEOUS PINNING OF HUMERUS FRACTURE  1970   hip    Family History  Problem Relation Age of Onset  . Hypertension Sister   . Diabetes Sister   . Hypertension Brother   . Heart disease Brother   . Cancer Maternal Aunt        colon  . Diabetes Maternal Grandmother     Social History   Socioeconomic History  . Marital status: Married    Spouse name: Not on file  . Number of children: Not on file  . Years of education: Not on file  . Highest education level: Not on file  Social Needs  . Financial resource strain: Not on file  . Food insecurity - worry: Not on file  . Food insecurity - inability: Not on file  . Transportation needs - medical: Not on file  . Transportation needs - non-medical: Not on file  Occupational History  . Not on file  Tobacco Use  . Smoking status: Never  Smoker  . Smokeless tobacco: Never Used  Substance and Sexual Activity  . Alcohol use: No    Alcohol/week: 0.0 oz  . Drug use: No  . Sexual activity: No  Other Topics Concern  . Not on file  Social History Narrative  . Not on file     Current Outpatient Medications:  .  amLODipine (NORVASC) 10 MG tablet, Take 1 tablet (10 mg total) by mouth daily., Disp: 30 tablet, Rfl: 5 .  aspirin EC 81 MG tablet, Take 81 mg by mouth., Disp: , Rfl:  .  atenolol (TENORMIN) 100 MG tablet, Take 1 tablet (100 mg total) by mouth daily., Disp: 30 tablet, Rfl: 5 .  donepezil (ARICEPT) 5 MG tablet, Take 1 tablet (5 mg total) by mouth at bedtime., Disp: 42 tablet, Rfl: 0 .  hydrochlorothiazide (HYDRODIURIL) 25 MG tablet, Take 1 tablet (25 mg total) by mouth daily., Disp: 90 tablet, Rfl: 0 .  meloxicam (MOBIC) 7.5 MG tablet, TAKE 1 TABLET TWICE A DAY WITH FOOD, Disp: 180 tablet, Rfl: 0 .  Omega-3 Fatty Acids (FISH OIL CONCENTRATE) 300 MG CAPS, Take by mouth., Disp: , Rfl:  .  oxybutynin (DITROPAN-XL) 10 MG 24 hr tablet, , Disp: , Rfl:  .  oxyCODONE-acetaminophen (PERCOCET/ROXICET) 5-325 MG per tablet, Take 1 tablet  by mouth. , Disp: , Rfl:  .  pyridOXINE (VITAMIN B-6) 50 MG tablet, Take 50 mg by mouth., Disp: , Rfl:  .  Calcium Carb-Cholecalciferol (CALTRATE 600+D) 600-800 MG-UNIT TABS, Take by mouth., Disp: , Rfl:  .  conjugated estrogens (PREMARIN) vaginal cream, Frequency:PHARMDIR   Dosage:0.0     Instructions:  Note:twice weekly (Sunday and Wednesday) Dose: 0.625MG /G, Disp: , Rfl:  .  cyclobenzaprine (FLEXERIL) 5 MG tablet, Take 5 mg by mouth at bedtime. , Disp: , Rfl:   Allergies  Allergen Reactions  . Ace Inhibitors     Angioedema  . Azor  [Amlodipine-Olmesartan] Swelling  . Benazepril Cough     Review of Systems  Constitutional: Positive for malaise/fatigue. Negative for chills and fever.  Cardiovascular: Negative for chest pain.  Musculoskeletal: Positive for joint pain.       Objective  Vitals:   10/11/17 1149  BP: (!) 112/48  Pulse: (!) 52  Resp: 14  Temp: 97.6 F (36.4 C)  TempSrc: Oral  SpO2: 93%  Weight: 125 lb 1.6 oz (56.7 kg)    Physical Exam  Constitutional: She is oriented to person, place, and time and well-developed, well-nourished, and in no distress.  HENT:  Head: Normocephalic and atraumatic.  Cardiovascular: Normal rate, regular rhythm and normal heart sounds.  No murmur heard. Pulmonary/Chest: Effort normal and breath sounds normal. She has no wheezes.  Neurological: She is alert and oriented to person, place, and time.  Psychiatric: Affect and judgment normal.  Nursing note and vitals reviewed.      Recent Results (from the past 2160 hour(s))  CBC with Differential/Platelet     Status: None   Collection Time: 08/30/17  2:43 PM  Result Value Ref Range   WBC 8.0 3.8 - 10.8 Thousand/uL   RBC 4.69 3.80 - 5.10 Million/uL   Hemoglobin 14.2 11.7 - 15.5 g/dL   HCT 16.1 09.6 - 04.5 %   MCV 91.3 80.0 - 100.0 fL   MCH 30.3 27.0 - 33.0 pg   MCHC 33.2 32.0 - 36.0 g/dL   RDW 40.9 81.1 - 91.4 %   Platelets 270 140 - 400 Thousand/uL   MPV 11.0 7.5 - 12.5 fL   Neutro Abs 5,416 1,500 - 7,800 cells/uL   Lymphs Abs 1,736 850 - 3,900 cells/uL   WBC mixed population 680 200 - 950 cells/uL   Eosinophils Absolute 104 15 - 500 cells/uL   Basophils Absolute 64 0 - 200 cells/uL   Neutrophils Relative % 67.7 %   Total Lymphocyte 21.7 %   Monocytes Relative 8.5 %   Eosinophils Relative 1.3 %   Basophils Relative 0.8 %  COMPLETE METABOLIC PANEL WITH GFR     Status: Abnormal   Collection Time: 08/30/17  2:43 PM  Result Value Ref Range   Glucose, Bld 89 65 - 139 mg/dL    Comment: .        Non-fasting reference interval .    BUN 26 (H) 7 - 25 mg/dL   Creat 7.82 9.56 - 2.13 mg/dL    Comment: For patients >82 years of age, the reference limit for Creatinine is approximately 13% higher for people identified as African-American. .     GFR, Est Non African American 67 > OR = 60 mL/min/1.37m2   GFR, Est African American 77 > OR = 60 mL/min/1.23m2   BUN/Creatinine Ratio 32 (H) 6 - 22 (calc)   Sodium 139 135 - 146 mmol/L   Potassium 3.9 3.5 - 5.3 mmol/L  Chloride 98 98 - 110 mmol/L   CO2 30 20 - 32 mmol/L   Calcium 10.0 8.6 - 10.4 mg/dL   Total Protein 7.0 6.1 - 8.1 g/dL   Albumin 4.3 3.6 - 5.1 g/dL   Globulin 2.7 1.9 - 3.7 g/dL (calc)   AG Ratio 1.6 1.0 - 2.5 (calc)   Total Bilirubin 0.9 0.2 - 1.2 mg/dL   Alkaline phosphatase (APISO) 55 33 - 130 U/L   AST 22 10 - 35 U/L   ALT 13 6 - 29 U/L  TSH     Status: None   Collection Time: 08/30/17  2:43 PM  Result Value Ref Range   TSH 1.16 0.40 - 4.50 mIU/L  VITAMIN D 25 Hydroxy (Vit-D Deficiency, Fractures)     Status: None   Collection Time: 08/30/17  2:43 PM  Result Value Ref Range   Vit D, 25-Hydroxy 32 30 - 100 ng/mL    Comment: Vitamin D Status         25-OH Vitamin D: . Deficiency:                    <20 ng/mL Insufficiency:             20 - 29 ng/mL Optimal:                 > or = 30 ng/mL . For 25-OH Vitamin D testing on patients on  D2-supplementation and patients for whom quantitation  of D2 and D3 fractions is required, the QuestAssureD(TM) 25-OH VIT D, (D2,D3), LC/MS/MS is recommended: order  code [id number] (patients >453yrs). . For more information on this test, go to: http://education.questdiagnostics.com/faq/FAQ163 (This link is being provided for  informational/educational purposes only.)   B12     Status: None   Collection Time: 08/30/17  2:43 PM  Result Value Ref Range   Vitamin B-12 240 200 - 1,100 pg/mL    Comment: . Please Note: Although the reference range for vitamin B12 is [phone] pg/mL, it has been reported that between 5 and 10% of patients with values between 200 and 400 pg/mL may experience neuropsychiatric and hematologic abnormalities due to occult B12 deficiency; less than 1% of patients with values above 400 pg/mL will have  symptoms. .   Folate     Status: None   Collection Time: 08/30/17  2:43 PM  Result Value Ref Range   Folate 10.9 ng/mL    Comment:                            Reference Range                            Low:           <3.4                            Borderline:    3.4-5.4                            Normal:        >5.4 .      Assessment & Plan  1. Essential hypertension  Below normal blood pressure, advised to discontinue amlodipine as recommended by the neurologist, keep blood pressure logs 2. Late onset Alzheimer's disease without behavioral disturbance  stable, continue on Aricept,  may consider adding Namenda at a follow-up visit  3. Dyspnea, unspecified type Likely because of deconditioning, normal lung exam, reassured   Maverick Dieudonne Asad A. Faylene KurtzShah [facility] 10/11/2017 11:54 AM

## 2017-10-17 ENCOUNTER — Other Ambulatory Visit: Payer: Self-pay | Admitting: Family Medicine

## 2017-10-17 DIAGNOSIS — G301 Alzheimer's disease with late onset: Principal | ICD-10-CM

## 2017-10-17 DIAGNOSIS — F0280 Dementia in other diseases classified elsewhere without behavioral disturbance: Secondary | ICD-10-CM

## 2017-10-18 ENCOUNTER — Other Ambulatory Visit: Payer: Self-pay | Admitting: Family Medicine

## 2017-10-18 DIAGNOSIS — G301 Alzheimer's disease with late onset: Principal | ICD-10-CM

## 2017-10-18 DIAGNOSIS — F0280 Dementia in other diseases classified elsewhere without behavioral disturbance: Secondary | ICD-10-CM

## 2017-11-04 ENCOUNTER — Telehealth: Payer: Self-pay | Admitting: Family Medicine

## 2017-11-04 NOTE — Telephone Encounter (Signed)
Copied from CRM [phone]. Topic: General - Other >> Nov 04, 2017 11:30 AM Stephannie LiSimmons, TRUE Garciamartinez L, NT wrote: Reason for CRM: Patient  has misplaced blood pressure medicine ,refill is not due until January ,was picked up on Tuesday  please advise [phone] this is her sister that called ,  3 blood pressure medicines ,1. Atenolol 2. Amlodipine besylate 3.unknown  Hydrozhlorthiazide is half full unsure of what medicine this is

## 2017-11-07 NOTE — Telephone Encounter (Signed)
Patient sister stated she was able to refill the medicine; she just had to pay regular price for it.

## 2017-11-14 ENCOUNTER — Other Ambulatory Visit: Payer: Self-pay

## 2017-11-14 ENCOUNTER — Encounter: Payer: Self-pay | Admitting: Emergency Medicine

## 2017-11-14 ENCOUNTER — Inpatient Hospital Stay
Admission: EM | Admit: 2017-11-14 | Discharge: 2017-12-16 | DRG: 154 | Disposition: E | Payer: Medicare Other | Attending: Internal Medicine | Admitting: Internal Medicine

## 2017-11-14 ENCOUNTER — Ambulatory Visit: Payer: Self-pay

## 2017-11-14 ENCOUNTER — Emergency Department: Payer: Medicare Other

## 2017-11-14 DIAGNOSIS — N179 Acute kidney failure, unspecified: Secondary | ICD-10-CM | POA: Diagnosis not present

## 2017-11-14 DIAGNOSIS — G4700 Insomnia, unspecified: Secondary | ICD-10-CM | POA: Diagnosis not present

## 2017-11-14 DIAGNOSIS — I959 Hypotension, unspecified: Secondary | ICD-10-CM | POA: Diagnosis present

## 2017-11-14 DIAGNOSIS — N3001 Acute cystitis with hematuria: Secondary | ICD-10-CM | POA: Diagnosis present

## 2017-11-14 DIAGNOSIS — Z66 Do not resuscitate: Secondary | ICD-10-CM | POA: Diagnosis present

## 2017-11-14 DIAGNOSIS — R531 Weakness: Secondary | ICD-10-CM | POA: Diagnosis present

## 2017-11-14 DIAGNOSIS — R451 Restlessness and agitation: Secondary | ICD-10-CM | POA: Diagnosis not present

## 2017-11-14 DIAGNOSIS — R0902 Hypoxemia: Secondary | ICD-10-CM

## 2017-11-14 DIAGNOSIS — B9620 Unspecified Escherichia coli [E. coli] as the cause of diseases classified elsewhere: Secondary | ICD-10-CM | POA: Diagnosis not present

## 2017-11-14 DIAGNOSIS — E860 Dehydration: Secondary | ICD-10-CM | POA: Diagnosis not present

## 2017-11-14 DIAGNOSIS — J9811 Atelectasis: Secondary | ICD-10-CM | POA: Diagnosis present

## 2017-11-14 DIAGNOSIS — R1310 Dysphagia, unspecified: Secondary | ICD-10-CM

## 2017-11-14 DIAGNOSIS — Z515 Encounter for palliative care: Secondary | ICD-10-CM | POA: Diagnosis present

## 2017-11-14 DIAGNOSIS — R634 Abnormal weight loss: Secondary | ICD-10-CM | POA: Diagnosis present

## 2017-11-14 DIAGNOSIS — F0390 Unspecified dementia without behavioral disturbance: Secondary | ICD-10-CM | POA: Diagnosis present

## 2017-11-14 DIAGNOSIS — Z7982 Long term (current) use of aspirin: Secondary | ICD-10-CM

## 2017-11-14 DIAGNOSIS — R627 Adult failure to thrive: Secondary | ICD-10-CM | POA: Diagnosis present

## 2017-11-14 DIAGNOSIS — J3800 Paralysis of vocal cords and larynx, unspecified: Principal | ICD-10-CM | POA: Diagnosis present

## 2017-11-14 DIAGNOSIS — Z8249 Family history of ischemic heart disease and other diseases of the circulatory system: Secondary | ICD-10-CM

## 2017-11-14 DIAGNOSIS — R262 Difficulty in walking, not elsewhere classified: Secondary | ICD-10-CM | POA: Diagnosis not present

## 2017-11-14 DIAGNOSIS — R4702 Dysphasia: Secondary | ICD-10-CM | POA: Diagnosis not present

## 2017-11-14 DIAGNOSIS — Z79899 Other long term (current) drug therapy: Secondary | ICD-10-CM

## 2017-11-14 DIAGNOSIS — I10 Essential (primary) hypertension: Secondary | ICD-10-CM | POA: Diagnosis not present

## 2017-11-14 DIAGNOSIS — R509 Fever, unspecified: Secondary | ICD-10-CM | POA: Diagnosis not present

## 2017-11-14 DIAGNOSIS — M6281 Muscle weakness (generalized): Secondary | ICD-10-CM | POA: Diagnosis not present

## 2017-11-14 DIAGNOSIS — Z888 Allergy status to other drugs, medicaments and biological substances status: Secondary | ICD-10-CM

## 2017-11-14 DIAGNOSIS — J9600 Acute respiratory failure, unspecified whether with hypoxia or hypercapnia: Secondary | ICD-10-CM

## 2017-11-14 DIAGNOSIS — N390 Urinary tract infection, site not specified: Secondary | ICD-10-CM | POA: Diagnosis present

## 2017-11-14 DIAGNOSIS — E785 Hyperlipidemia, unspecified: Secondary | ICD-10-CM | POA: Diagnosis not present

## 2017-11-14 DIAGNOSIS — J9601 Acute respiratory failure with hypoxia: Secondary | ICD-10-CM | POA: Diagnosis not present

## 2017-11-14 DIAGNOSIS — R0602 Shortness of breath: Secondary | ICD-10-CM | POA: Diagnosis not present

## 2017-11-14 DIAGNOSIS — Z7189 Other specified counseling: Secondary | ICD-10-CM | POA: Diagnosis not present

## 2017-11-14 LAB — URINALYSIS, COMPLETE (UACMP) WITH MICROSCOPIC
BILIRUBIN URINE: NEGATIVE
GLUCOSE, UA: NEGATIVE mg/dL
KETONES UR: 5 mg/dL — AB
NITRITE: POSITIVE — AB
PH: 5 (ref 5.0–8.0)
PROTEIN: 30 mg/dL — AB
Specific Gravity, Urine: 1.012 (ref 1.005–1.030)

## 2017-11-14 LAB — COMPREHENSIVE METABOLIC PANEL
ALBUMIN: 4 g/dL (ref 3.5–5.0)
ALT: 10 U/L — ABNORMAL LOW (ref 14–54)
ANION GAP: 9 (ref 5–15)
AST: 21 U/L (ref 15–41)
Alkaline Phosphatase: 46 U/L (ref 38–126)
BILIRUBIN TOTAL: 1.3 mg/dL — AB (ref 0.3–1.2)
BUN: 21 mg/dL — ABNORMAL HIGH (ref 6–20)
CALCIUM: 9.6 mg/dL (ref 8.9–10.3)
CO2: 28 mmol/L (ref 22–32)
Chloride: 104 mmol/L (ref 101–111)
Creatinine, Ser: 0.85 mg/dL (ref 0.44–1.00)
GFR calc non Af Amer: >60 mL/min (ref >60)
GLUCOSE: 88 mg/dL (ref 65–99)
POTASSIUM: 4.2 mmol/L (ref 3.5–5.1)
SODIUM: 141 mmol/L (ref 135–145)
TOTAL PROTEIN: 6.8 g/dL (ref 6.5–8.1)

## 2017-11-14 LAB — CBC WITH DIFFERENTIAL/PLATELET
BASOS ABS: 0.1 10*3/uL (ref 0–0.1)
BASOS PCT: 1 %
Eosinophils Absolute: 0.2 10*3/uL (ref 0–0.7)
Eosinophils Relative: 3 %
HEMATOCRIT: 42.8 % (ref 35.0–47.0)
HEMOGLOBIN: 14 g/dL (ref 12.0–16.0)
LYMPHS PCT: 22 %
Lymphs Abs: 1.6 10*3/uL (ref 1.0–3.6)
MCH: 30.4 pg (ref 26.0–34.0)
MCHC: 32.8 g/dL (ref 32.0–36.0)
MCV: 92.7 fL (ref 80.0–100.0)
MONO ABS: 0.7 10*3/uL (ref 0.2–0.9)
Monocytes Relative: 9 %
NEUTROS ABS: 4.9 10*3/uL (ref 1.4–6.5)
NEUTROS PCT: 65 %
Platelets: 198 10*3/uL (ref 150–440)
RBC: 4.61 MIL/uL (ref 3.80–5.20)
RDW: 13.7 % (ref 11.5–14.5)
WBC: 7.5 10*3/uL (ref 3.6–11.0)

## 2017-11-14 LAB — LACTIC ACID, PLASMA
LACTIC ACID, VENOUS: 1.1 mmol/L (ref 0.5–1.9)
Lactic Acid, Venous: 0.9 mmol/L (ref 0.5–1.9)

## 2017-11-14 MED ORDER — SODIUM CHLORIDE 0.9 % IV SOLN
Freq: Once | INTRAVENOUS | Status: AC
  Administered 2017-11-14: 16:00:00 via INTRAVENOUS

## 2017-11-14 MED ORDER — DEXTROSE 5 % IV SOLN
1.0000 g | INTRAVENOUS | Status: DC
  Administered 2017-11-15 – 2017-11-16 (×2): 1 g via INTRAVENOUS
  Filled 2017-11-14 (×3): qty 10

## 2017-11-14 MED ORDER — DEXTROSE 5 % IV SOLN
Freq: Once | INTRAVENOUS | Status: AC
  Administered 2017-11-14: 17:00:00 via INTRAVENOUS
  Filled 2017-11-14: qty 10

## 2017-11-14 MED ORDER — ACETAMINOPHEN 650 MG RE SUPP: 650.0000 mg | Freq: Four times a day (QID) | RECTAL | Status: DC | PRN

## 2017-11-14 MED ORDER — ACETAMINOPHEN 325 MG PO TABS: 650.0000 mg | ORAL_TABLET | Freq: Four times a day (QID) | ORAL | Status: DC | PRN

## 2017-11-14 MED ORDER — SODIUM CHLORIDE 0.9 % IV SOLN
INTRAVENOUS | Status: AC
  Administered 2017-11-14: 23:00:00 via INTRAVENOUS

## 2017-11-14 MED ORDER — ENOXAPARIN SODIUM 40 MG/0.4ML ~~LOC~~ SOLN
40.0000 mg | SUBCUTANEOUS | Status: DC
  Administered 2017-11-14: 40 mg via SUBCUTANEOUS
  Filled 2017-11-14 (×2): qty 0.4

## 2017-11-14 MED ORDER — ASPIRIN EC 81 MG PO TBEC
81.0000 mg | DELAYED_RELEASE_TABLET | Freq: Every day | ORAL | Status: DC
  Administered 2017-11-14: 21:00:00 81 mg via ORAL
  Filled 2017-11-14: qty 1

## 2017-11-14 MED ORDER — CEFTRIAXONE SODIUM IN DEXTROSE 20 MG/ML IV SOLN
1.0000 g | Freq: Once | INTRAVENOUS | Status: DC
  Filled 2017-11-14: qty 50

## 2017-11-14 MED ORDER — HALOPERIDOL LACTATE 5 MG/ML IJ SOLN
1.0000 mg | Freq: Once | INTRAMUSCULAR | Status: AC
  Administered 2017-11-14: 1 mg via INTRAVENOUS
  Filled 2017-11-14: qty 1

## 2017-11-14 MED ORDER — POLYETHYLENE GLYCOL 3350 17 G PO PACK: 17.0000 g | PACK | Freq: Every day | ORAL | Status: DC | PRN

## 2017-11-14 MED ORDER — AMLODIPINE BESYLATE 10 MG PO TABS
10.0000 mg | ORAL_TABLET | Freq: Every day | ORAL | Status: DC
  Administered 2017-11-14: 10 mg via ORAL
  Filled 2017-11-14: qty 1

## 2017-11-14 MED ORDER — DONEPEZIL HCL 5 MG PO TABS
5.0000 mg | ORAL_TABLET | Freq: Every day | ORAL | Status: DC
  Administered 2017-11-14 – 2017-11-15 (×2): 5 mg via ORAL
  Filled 2017-11-14 (×3): qty 1

## 2017-11-14 MED ORDER — SODIUM CHLORIDE 0.9 % IV SOLN
Freq: Once | INTRAVENOUS | Status: AC
  Administered 2017-11-14: 19:00:00 via INTRAVENOUS

## 2017-11-14 MED ORDER — MELOXICAM 7.5 MG PO TABS
7.5000 mg | ORAL_TABLET | Freq: Two times a day (BID) | ORAL | Status: DC
  Filled 2017-11-14: qty 1

## 2017-11-14 MED ORDER — ATENOLOL 100 MG PO TABS
100.0000 mg | ORAL_TABLET | Freq: Every day | ORAL | Status: DC
  Administered 2017-11-14: 100 mg via ORAL
  Filled 2017-11-14 (×2): qty 1

## 2017-11-14 NOTE — Telephone Encounter (Addendum)
Agree with above, it is likely that her dementia has worsened which is contributing to increased difficulty with mastication and swallowing. She needs to be examined and evaluated in the ER for proper disposition

## 2017-11-14 NOTE — ED Notes (Signed)
AAOx3.  Skin warm and dry.  NAD 

## 2017-11-14 NOTE — ED Provider Notes (Signed)
[facility] Emergency Department Provider Note    ____________________________________________   I have reviewed the triage vital signs and the nursing notes.   HISTORY  Chief Complaint Difficulty with swallowing  History limited by and level 5 caveat due to: Dementia   HPI Amanda Jacobs is a 83 y.o. female who presents to the emergency department today because of family concern for difficulty with swallowing.   LOCATION:throat DURATION:over 1 month TIMING: constant SEVERITY: severe QUALITY: choking CONTEXT: patient has been having difficulty with her swallowing for over one month. Family states that she chokes and feels like things are getting stuck in her throat. Family is concerned that she might not have had anything to eat for the past 9 days. MODIFYING FACTORS: none ASSOCIATED SYMPTOMS: agitation today. Intermittent left eye drooping which family states comes and goes.   Per medical record review patient has a history of HTN.  Past Medical History:  Diagnosis Date  . Allergy   . Hypertension     Patient Active Problem List   Diagnosis Date Noted  . Medicare annual wellness visit, subsequent 01/31/2017  . Screening for colon cancer 01/31/2017  . Screening for breast cancer 01/31/2017  . Post-menopausal 01/31/2017  . ERRONEOUS ENCOUNTER--DISREGARD 10/07/2015  . Hypertension 07/01/2015  . Dyslipidemia 07/01/2015  . Absence of bladder continence 07/01/2015  . Pelvic relaxation due to vaginal prolapse 03/10/2015    Past Surgical History:  Procedure Laterality Date  . CLOSED REDUCTION AND PERCUTANEOUS PINNING OF HUMERUS FRACTURE  1970   hip    Prior to Admission medications   Medication Sig Start Date End Date Taking? Authorizing Provider  amLODipine (NORVASC) 10 MG tablet Take 1 tablet (10 mg total) by mouth daily. 08/23/17   Ellyn HackShah, Syed Asad A, MD  aspirin EC 81 MG tablet Take 81 mg by mouth.    [provider]   atenolol (TENORMIN) 100 MG tablet Take 1 tablet (100 mg total) by mouth daily. 08/23/17   Ellyn HackShah, Syed Asad A, MD  Calcium Carb-Cholecalciferol (CALTRATE 600+D) 600-800 MG-UNIT TABS Take by mouth. 02/02/13   [provider]  conjugated estrogens (PREMARIN) vaginal cream Frequency:PHARMDIR   Dosage:0.0     Instructions:  Note:twice weekly (Sunday and Wednesday) Dose: 0.625MG /G 09/28/12   [provider]  cyclobenzaprine (FLEXERIL) 5 MG tablet Take 5 mg by mouth at bedtime.  06/23/12   [provider]  donepezil (ARICEPT) 5 MG tablet TAKE ONE TABLET BY MOUTH AT BEDTIME. 10/17/17   Velta AddisonShah, Syed Asad A, MD  donepezil (ARICEPT) 5 MG tablet TAKE ONE TABLET BY MOUTH AT BEDTIME. 10/18/17   Ellyn HackShah, Syed Asad A, MD  hydrochlorothiazide (HYDRODIURIL) 25 MG tablet Take 1 tablet (25 mg total) by mouth daily. 08/23/17   Ellyn HackShah, Syed Asad A, MD  meloxicam (MOBIC) 7.5 MG tablet TAKE 1 TABLET TWICE A DAY WITH FOOD 08/30/17   Ellyn HackShah, Syed Asad A, MD  Omega-3 Fatty Acids (FISH OIL CONCENTRATE) 300 MG CAPS Take by mouth. 02/02/13   [provider]  oxybutynin (DITROPAN-XL) 10 MG 24 hr tablet  04/25/15   [provider]  oxyCODONE-acetaminophen (PERCOCET/ROXICET) 5-325 MG per tablet Take 1 tablet by mouth.  02/28/15   [provider]  pyridOXINE (VITAMIN B-6) 50 MG tablet Take 50 mg by mouth. 02/02/13   [provider]    Allergies Ace inhibitors; Azor  [amlodipine-olmesartan]; and Benazepril  Family History  Problem Relation Age of Onset  . Hypertension Sister   . Diabetes Sister   .  Hypertension Brother   . Heart disease Brother   . Cancer Maternal Aunt        colon  . Diabetes Maternal Grandmother     Social History Social History   Tobacco Use  . Smoking status: Never Smoker  . Smokeless tobacco: Never Used  Substance Use Topics  . Alcohol use: No    Alcohol/week: 0.0 oz  . Drug use: No    Review of Systems Constitutional: No fever/chills Eyes: No  visual changes. ENT: Feeling of inability to pass food through the throat.  Cardiovascular: Denies chest pain. Respiratory: Denies shortness of breath. Gastrointestinal: Positive with difficulty with swallowing.  Genitourinary: Negative for dysuria. Musculoskeletal: Negative for back pain. Skin: Negative for rash. Neurological: Negative for headaches, focal weakness or numbness.  ____________________________________________   PHYSICAL EXAM:  VITAL SIGNS: ED Triage Vitals  Enc Vitals Group     BP 11/09/2017 1453 (!) 158/55     Pulse Rate 10/25/2017 1453 62     Resp 11/03/2017 1453 (!) 30     Temp 10/21/2017 1453 97.8 F (36.6 C)     Temp Source 10/27/2017 1453 Oral     SpO2 10/15/2017 1453 97 %     Weight 10/24/2017 1454 125 lb (56.7 kg)     Height 11/03/2017 1454 5\' 6"  (1.676 m)   Constitutional: Awake and alert. Well appearing and in no distress. Eyes: Conjunctivae are normal.  ENT   Head: Normocephalic and atraumatic.   Nose: No congestion/rhinnorhea.   Mouth/Throat: Mucous membranes are moist.   Neck: No stridor. Hematological/Lymphatic/Immunilogical: No cervical lymphadenopathy. Cardiovascular: Normal rate, regular rhythm.  No murmurs, rubs, or gallops.  Respiratory: Normal respiratory effort without tachypnea nor retractions. Breath sounds are clear and equal bilaterally. No wheezes/rales/rhonchi. Gastrointestinal: Soft and non tender. No rebound. No guarding.  Genitourinary: Deferred Musculoskeletal: Normal range of motion in all extremities. No lower extremity edema. Neurologic:  Normal speech and language. No gross focal neurologic deficits are appreciated.  Skin:  Skin is warm, dry and intact. No rash noted. Psychiatric: Mood and affect are normal. Speech and behavior are normal. Patient exhibits appropriate insight and judgment.  ____________________________________________    LABS (pertinent positives/negatives)  UA Nitrite positive, leukocytes large, too  numerous to count WBC CBC wnl  ____________________________________________   EKG  I, Phineas Semen, attending physician, personally viewed and interpreted this EKG  EKG Time: 1509 Rate: 60 Rhythm: normal sinus rhythm Axis: normal Intervals: qtc 431 QRS: narrow ST changes: no st elevation Impression: normal ekg   ____________________________________________    RADIOLOGY  CXR No acute disease  ____________________________________________   PROCEDURES  Procedures  ____________________________________________   INITIAL IMPRESSION / ASSESSMENT AND PLAN / ED COURSE  Pertinent labs & imaging results that were available during my care of the patient were reviewed by me and considered in my medical decision making (see chart for details).  Patient presented to the emergency department today because of concern for difficulty with swallowing. This has been progressively worsening for the past month. On exam patient not completely oriented. Does appear to have some memory issues. Per family the patient was also quite agitated today which is abnormal for her. On my exam patient not agitated. Work up however is consistent with a UTI. Will plan on admission to the hospitalist service. Discussed findings and plan with patient and family.   ____________________________________________   FINAL CLINICAL IMPRESSION(S) / ED DIAGNOSES  Final diagnoses:  Lower urinary tract infectious disease  Agitation  Difficulty swallowing, mixes  medication with food     Note: This dictation was prepared with Dragon dictation. Any transcriptional errors that result from this process are unintentional     Phineas SemenGoodman, Royalty Fakhouri, MD 11/06/2017 [phone]

## 2017-11-14 NOTE — H&P (Signed)
[facility] - [place] at [facility]   PATIENT NAME: Amanda Jacobs    MR#:  [id number]  DATE OF BIRTH:  September 11, 1931  DATE OF ADMISSION:  07/16/2017  PRIMARY CARE PHYSICIAN: Ellyn HackShah, Syed Asad A, MD   REQUESTING/REFERRING PHYSICIAN: Dr Derrill KayGoodman  CHIEF COMPLAINT:  Generalized weakness not her usual self  HISTORY OF PRESENT ILLNESS:  Amanda Peacedna Wormley  is a 83 y.o. female with a known history of new onset diagnosis of dementia, hypertension comes to the emergency room brought in by family for patient being weak difficulty swallowing for last few days and foul-smelling urine. Patient is hemodynamically stable.  Workup in the ER showed patient has UTI.  Per family she has been having poor p.o. intake secondary to coughing while she eats. No evaluation regarding her dysphagia has been done as outpatient ,patient is being admitted for further evaluation and management  PAST MEDICAL HISTORY:   Past Medical History:  Diagnosis Date  . Allergy   . Hypertension     PAST SURGICAL HISTOIRY:   Past Surgical History:  Procedure Laterality Date  . CLOSED REDUCTION AND PERCUTANEOUS PINNING OF HUMERUS FRACTURE  1970   hip    SOCIAL HISTORY:   Social History   Tobacco Use  . Smoking status: Never Smoker  . Smokeless tobacco: Never Used  Substance Use Topics  . Alcohol use: No    Alcohol/week: 0.0 oz    FAMILY HISTORY:   Family History  Problem Relation Age of Onset  . Hypertension Sister   . Diabetes Sister   . Hypertension Brother   . Heart disease Brother   . Cancer Maternal Aunt        colon  . Diabetes Maternal Grandmother     DRUG ALLERGIES:   Allergies  Allergen Reactions  . Ace Inhibitors     Angioedema  . Azor  [Amlodipine-Olmesartan] Swelling  . Benazepril Cough    REVIEW OF SYSTEMS:  Review of Systems  Constitutional: Negative for chills, fever and weight loss.  HENT: Negative for ear discharge, ear pain and nosebleeds.   Eyes:  Negative for blurred vision, pain and discharge.  Respiratory: Negative for sputum production, shortness of breath, wheezing and stridor.   Cardiovascular: Negative for chest pain, palpitations, orthopnea and PND.  Gastrointestinal: Negative for abdominal pain, diarrhea, nausea and vomiting.  Genitourinary: Positive for dysuria and frequency. Negative for urgency.  Musculoskeletal: Negative for back pain and joint pain.  Neurological: Positive for weakness. Negative for sensory change, speech change and focal weakness.  Psychiatric/Behavioral: Negative for depression and hallucinations. The patient is not nervous/anxious.      MEDICATIONS AT HOME:   Prior to Admission medications   Medication Sig Start Date End Date Taking? Authorizing Provider  amLODipine (NORVASC) 10 MG tablet Take 1 tablet (10 mg total) by mouth daily. 08/23/17   Ellyn HackShah, Syed Asad A, MD  aspirin EC 81 MG tablet Take 81 mg by mouth.    [provider]  atenolol (TENORMIN) 100 MG tablet Take 1 tablet (100 mg total) by mouth daily. 08/23/17   Ellyn HackShah, Syed Asad A, MD  Calcium Carb-Cholecalciferol (CALTRATE 600+D) 600-800 MG-UNIT TABS Take by mouth. 02/02/13   [provider]  conjugated estrogens (PREMARIN) vaginal cream Frequency:PHARMDIR   Dosage:0.0     Instructions:  Note:twice weekly (Sunday and Wednesday) Dose: 0.625MG /G 09/28/12   [provider]  cyclobenzaprine (FLEXERIL) 5 MG tablet Take 5 mg by mouth at bedtime.  06/23/12   [provider]  donepezil (ARICEPT) 5 MG tablet TAKE ONE TABLET BY MOUTH AT BEDTIME. 10/17/17   Ellyn Hack, MD  hydrochlorothiazide (HYDRODIURIL) 25 MG tablet Take 1 tablet (25 mg total) by mouth daily. 08/23/17   Ellyn Hack, MD  meloxicam (MOBIC) 7.5 MG tablet TAKE 1 TABLET TWICE A DAY WITH FOOD 08/30/17   Ellyn Hack, MD  Omega-3 Fatty Acids (FISH OIL CONCENTRATE) 300 MG CAPS Take by mouth. 02/02/13   [provider]  oxybutynin  (DITROPAN-XL) 10 MG 24 hr tablet  04/25/15   [provider]  oxyCODONE-acetaminophen (PERCOCET/ROXICET) 5-325 MG per tablet Take 1 tablet by mouth.  02/28/15   [provider]  pyridOXINE (VITAMIN B-6) 50 MG tablet Take 50 mg by mouth. 02/02/13   [provider]      VITAL SIGNS:  Blood pressure (!) 158/55, pulse 62, temperature 97.8 F (36.6 C), temperature source Oral, resp. rate (!) 30, height 5\' 6"  (1.676 m), weight 79.8 kg (176 lb), SpO2 97 %.  PHYSICAL EXAMINATION:  GENERAL:  83 y.o.-year-old patient lying in the bed with no acute distress.  EYES: Pupils equal, round, reactive to light and accommodation. No scleral icterus. Extraocular muscles intact.  HEENT: Head atraumatic, normocephalic. Oropharynx and nasopharynx clear.  NECK:  Supple, no jugular venous distention. No thyroid enlargement, no tenderness.  LUNGS: Normal breath sounds bilaterally, no wheezing, rales,rhonchi or crepitation. No use of accessory muscles of respiration.  CARDIOVASCULAR: S1, S2 normal. No murmurs, rubs, or gallops.  ABDOMEN: Soft, nontender, nondistended. Bowel sounds present. No organomegaly or mass.  EXTREMITIES: No pedal edema, cyanosis, or clubbing.  NEUROLOGIC: Cranial nerves II through XII are intact. Muscle strength 5/5 in all extremities. Sensation intact. Gait not checked.  PSYCHIATRIC: The patient is alert and oriented x 3.  SKIN: No obvious rash, lesion, or ulcer.   LABORATORY PANEL:   CBC Recent Labs  Lab 11/10/2017 1514  WBC 7.5  HGB 14.0  HCT 42.8  PLT 198   ------------------------------------------------------------------------------------------------------------------  Chemistries  Recent Labs  Lab 11/03/2017 1514  NA 141  K 4.2  CL 104  CO2 28  GLUCOSE 88  BUN 21*  CREATININE 0.85  CALCIUM 9.6  AST 21  ALT 10*  ALKPHOS 46  BILITOT 1.3*    ------------------------------------------------------------------------------------------------------------------  Cardiac Enzymes No results for input(s): TROPONINI in the last 168 hours. ------------------------------------------------------------------------------------------------------------------  RADIOLOGY:  Dg Chest 2 View  Result Date: 11/07/2017 CLINICAL DATA:  History fevers EXAM: CHEST  2 VIEW COMPARISON:  01/07/2015 FINDINGS: Cardiac shadow is within normal limits. Aortic calcifications are noted. Degenerative changes are noted about the shoulder joints. The lungs are clear bilaterally. IMPRESSION: No active cardiopulmonary disease. Electronically Signed   By: Alcide Clever M.D.   On: 10/15/2017 16:02    EKG:    IMPRESSION AND PLAN:   Janey Petron  is a 83 y.o. female with a known history of new onset diagnosis of dementia, hypertension comes to the emergency room brought in by family for patient being weak difficulty swallowing for last few days and foul-smelling urine.  1.  Generalized weakness poor p.o. Intake/UTI -IV fluids -IV Rocephin Follow-up urine culture-  2.  Difficulty swallowing -Speech therapy to see patient -Consider barium swallow if needed  3.  Generalized weakness/difficulty ambulating -Physical therapy to see patient  4.  DVT prophylaxis subcu Lovenox   All the records are reviewed and case discussed with ED provider. Management plans discussed with the patient, family and they are in  agreement.  CODE STATUS: Full  TOTAL TIME TAKING CARE OF THIS PATIENT: 45 minutes.    Enedina FinnerSona Damontre Millea M.D on 10/30/2017 at 4:55 PM  Between 7am to 6pm - Pager - [phone]  After 6pm go to [email or web address] - password [email or web address] [facility]  [facility]  Office  [phone]  CC: Primary care physician; Ellyn HackShah, Syed Asad A, MD

## 2017-11-14 NOTE — ED Notes (Signed)
Family at bedside.  States that patient has not been able to eat anything except soup for the past 6-7 days due to "Patient getting choked when swallowing"

## 2017-11-14 NOTE — Progress Notes (Signed)
ACP family meeting note  Patient does not have a living will.  She came in with weakness and difficulty swallowing with foul-smelling urine.  Found to have UTI.  Patient was recently diagnosed by neurologist with dementia.  She lives with her husband who is 81 years old at home.  She walks with a walker.  Discussed with sister, patient's daughter and patient.  They request full code. Prognosis stable  Time spent 16 minutes

## 2017-11-14 NOTE — ED Notes (Signed)
Patient denies pain and is resting comfortably.  

## 2017-11-14 NOTE — ED Triage Notes (Addendum)
ARrives from home via [facility].  Patient reports feeling badly for the past several days.  EMS reports decreased PO intake x 8 days and little po intake for the past 24 hours.  Per EMS temp was 100.1 axillary.  Patient is AAOx3.  Skin warm and dry. NAD.  MM dry.  Strong smelling urine noted.  Patient states she was recently treated for UTI, but symptoms have not improved.  Continues to c/o urinary frequency.

## 2017-11-14 NOTE — Telephone Encounter (Signed)
Sister called to report pt. having difficulty with choking frequently on food and fluids.  Stated this was reported about 2 months ago to Dr. Sherryll BurgerShah, and has worsened.  Reported the pt chokes on creamed potatoes, soups, scrambled eggs, and fluids.  Stated the pt. pulls food out of her mouth when attempting to feed her, stating "I can't swallow that."   Reported the pt. has dementia, and has become more agitated.  Stated the last substantial food intake was eating soup, about 9-10 days ago.  Denies shortness of breath or cough, except with attempting to eat/ drink.  Stated that today she has some facial swelling on left side of face, and the left eye is almost swollen shut.  Also reported skin feels "moist" to touch.  Reported the pt. Is incontinent, and her pads have continued to be wet, and sometimes wets the bed.  Sister voiced concern of increased overall weakness, and of length of time that she hasn't had adequate caloric or fluid intake.  Per protocol, advised to take pt. To the ER for evaluation.  Advised if unable to transport the pt. In car, to call EMS.  Verb. Understanding; agrees with plan.       Reason for Disposition . [1] Severe difficulty swallowing (e.g., drooling or spitting, can't swallow water) AND [2] new onset AND [3] normal breathing  Answer Assessment - Initial Assessment Questions 1. SYMPTOM: "Are you having difficulty swallowing liquids, solids, or both?"     Cannot swallow liquids or solids but chokes 2. ONSET: "When did the swallowing problems begin?"      About 2 months ago, but has worsened.  3. CAUSE: "What do you think is causing the problem?"      Ongoing problems with swallowing 4. CHRONIC/RECURRENT: "Is this a new problem for you?"  If no, ask: "How long have you had this problem?" (e.g., days, weeks, months)      No; has been going on 2 months 5. OTHER SYMPTOMS: "Do you have any other symptoms?" (e.g., difficulty breathing, sore throat, swollen tongue, chest pain)  Coughing/ wheezing when trying to eat, only notices increased shortness of breath with choking; unable to eat or drink.  Has not eaten anything for about 9-10 days.   Becoming more agitated. Pulls food out of her mouth, because she has trouble swallowing.  6. PREGNANCY: "Is there any chance you are pregnant?" "When was your last menstrual period?"     no  Protocols used: SWALLOWING DIFFICULTY-A-AH

## 2017-11-15 ENCOUNTER — Observation Stay: Payer: Medicare Other

## 2017-11-15 DIAGNOSIS — Z7982 Long term (current) use of aspirin: Secondary | ICD-10-CM | POA: Diagnosis not present

## 2017-11-15 DIAGNOSIS — F0390 Unspecified dementia without behavioral disturbance: Secondary | ICD-10-CM | POA: Diagnosis present

## 2017-11-15 DIAGNOSIS — Z888 Allergy status to other drugs, medicaments and biological substances status: Secondary | ICD-10-CM | POA: Diagnosis not present

## 2017-11-15 DIAGNOSIS — J9811 Atelectasis: Secondary | ICD-10-CM | POA: Diagnosis present

## 2017-11-15 DIAGNOSIS — I10 Essential (primary) hypertension: Secondary | ICD-10-CM | POA: Diagnosis present

## 2017-11-15 DIAGNOSIS — J9601 Acute respiratory failure with hypoxia: Secondary | ICD-10-CM | POA: Diagnosis not present

## 2017-11-15 DIAGNOSIS — R531 Weakness: Secondary | ICD-10-CM | POA: Diagnosis present

## 2017-11-15 DIAGNOSIS — R1310 Dysphagia, unspecified: Secondary | ICD-10-CM | POA: Diagnosis present

## 2017-11-15 DIAGNOSIS — Z79899 Other long term (current) drug therapy: Secondary | ICD-10-CM | POA: Diagnosis not present

## 2017-11-15 DIAGNOSIS — R634 Abnormal weight loss: Secondary | ICD-10-CM | POA: Diagnosis present

## 2017-11-15 DIAGNOSIS — R0602 Shortness of breath: Secondary | ICD-10-CM | POA: Diagnosis not present

## 2017-11-15 DIAGNOSIS — R262 Difficulty in walking, not elsewhere classified: Secondary | ICD-10-CM | POA: Diagnosis present

## 2017-11-15 DIAGNOSIS — N390 Urinary tract infection, site not specified: Secondary | ICD-10-CM | POA: Diagnosis present

## 2017-11-15 DIAGNOSIS — I959 Hypotension, unspecified: Secondary | ICD-10-CM | POA: Diagnosis present

## 2017-11-15 DIAGNOSIS — Z515 Encounter for palliative care: Secondary | ICD-10-CM | POA: Diagnosis present

## 2017-11-15 DIAGNOSIS — B9620 Unspecified Escherichia coli [E. coli] as the cause of diseases classified elsewhere: Secondary | ICD-10-CM | POA: Diagnosis present

## 2017-11-15 DIAGNOSIS — G4700 Insomnia, unspecified: Secondary | ICD-10-CM | POA: Diagnosis present

## 2017-11-15 DIAGNOSIS — Z66 Do not resuscitate: Secondary | ICD-10-CM | POA: Diagnosis present

## 2017-11-15 DIAGNOSIS — N3001 Acute cystitis with hematuria: Secondary | ICD-10-CM | POA: Diagnosis present

## 2017-11-15 DIAGNOSIS — N179 Acute kidney failure, unspecified: Secondary | ICD-10-CM | POA: Diagnosis not present

## 2017-11-15 DIAGNOSIS — Z8249 Family history of ischemic heart disease and other diseases of the circulatory system: Secondary | ICD-10-CM | POA: Diagnosis not present

## 2017-11-15 DIAGNOSIS — R627 Adult failure to thrive: Secondary | ICD-10-CM | POA: Diagnosis present

## 2017-11-15 DIAGNOSIS — R0902 Hypoxemia: Secondary | ICD-10-CM | POA: Diagnosis not present

## 2017-11-15 DIAGNOSIS — Z7189 Other specified counseling: Secondary | ICD-10-CM | POA: Diagnosis not present

## 2017-11-15 DIAGNOSIS — R451 Restlessness and agitation: Secondary | ICD-10-CM | POA: Diagnosis not present

## 2017-11-15 DIAGNOSIS — J3800 Paralysis of vocal cords and larynx, unspecified: Secondary | ICD-10-CM | POA: Diagnosis present

## 2017-11-15 MED ORDER — HALOPERIDOL LACTATE 5 MG/ML IJ SOLN
1.0000 mg | Freq: Every evening | INTRAMUSCULAR | Status: DC | PRN
  Administered 2017-11-17: 1 mg via INTRAVENOUS
  Filled 2017-11-15: qty 1

## 2017-11-15 MED ORDER — SODIUM CHLORIDE 0.9 % IV SOLN: INTRAVENOUS | Status: AC

## 2017-11-15 MED ORDER — ONDANSETRON HCL 4 MG/2ML IJ SOLN
4.0000 mg | Freq: Four times a day (QID) | INTRAMUSCULAR | Status: DC | PRN
  Administered 2017-11-15: 01:00:00 4 mg via INTRAVENOUS
  Filled 2017-11-15: qty 2

## 2017-11-15 MED ORDER — LORAZEPAM 2 MG/ML IJ SOLN
0.5000 mg | Freq: Every evening | INTRAMUSCULAR | Status: DC | PRN
  Administered 2017-11-16: 0.5 mg via INTRAVENOUS
  Filled 2017-11-15: qty 1

## 2017-11-15 MED ORDER — IPRATROPIUM-ALBUTEROL 0.5-2.5 (3) MG/3ML IN SOLN
3.0000 mL | Freq: Four times a day (QID) | RESPIRATORY_TRACT | Status: DC
  Administered 2017-11-15 – 2017-11-16 (×3): 3 mL via RESPIRATORY_TRACT
  Filled 2017-11-15 (×3): qty 3

## 2017-11-15 MED ORDER — BUDESONIDE 0.5 MG/2ML IN SUSP
0.5000 mg | Freq: Two times a day (BID) | RESPIRATORY_TRACT | Status: DC
  Administered 2017-11-15 – 2017-11-17 (×4): 0.5 mg via RESPIRATORY_TRACT
  Filled 2017-11-15 (×4): qty 2

## 2017-11-15 MED ORDER — LORAZEPAM 2 MG/ML IJ SOLN
0.5000 mg | Freq: Once | INTRAMUSCULAR | Status: AC
  Administered 2017-11-15: 0.5 mg via INTRAVENOUS
  Filled 2017-11-15: qty 1

## 2017-11-15 MED ORDER — QUETIAPINE FUMARATE 25 MG PO TABS
25.0000 mg | ORAL_TABLET | Freq: Every day | ORAL | Status: DC
  Administered 2017-11-15: 25 mg via ORAL
  Filled 2017-11-15 (×2): qty 1

## 2017-11-15 MED ORDER — ORAL CARE MOUTH RINSE
15.0000 mL | Freq: Two times a day (BID) | OROMUCOSAL | Status: DC
  Administered 2017-11-15 – 2017-11-18 (×4): 15 mL via OROMUCOSAL

## 2017-11-15 MED ORDER — AMLODIPINE BESYLATE 5 MG PO TABS: 5.0000 mg | ORAL_TABLET | Freq: Every day | ORAL | Status: DC

## 2017-11-15 MED ORDER — PANTOPRAZOLE SODIUM 40 MG IV SOLR
40.0000 mg | Freq: Two times a day (BID) | INTRAVENOUS | Status: DC
  Administered 2017-11-15 – 2017-11-17 (×5): 40 mg via INTRAVENOUS
  Filled 2017-11-15 (×5): qty 40

## 2017-11-15 NOTE — Plan of Care (Signed)
  Education: Knowledge of General Education information will improve 11/15/2017 0441 - Not Progressing by Geri SeminoleJackson, Mayetta Castleman A, RN   Health Behavior/Discharge Planning: Ability to manage health-related needs will improve 11/15/2017 0441 - Progressing by Geri SeminoleJackson, Kydan Shanholtzer A, RN   Clinical Measurements: Ability to maintain clinical measurements within normal limits will improve 11/15/2017 0441 - Progressing by Geri SeminoleJackson, Ayla Dunigan A, RN Will remain free from infection 11/15/2017 0441 - Progressing by Geri SeminoleJackson, Tarick Parenteau A, RN Respiratory complications will improve 11/15/2017 0441 - Progressing by Geri SeminoleJackson, Miaisabella Bacorn A, RN   Activity: Risk for activity intolerance will decrease 11/15/2017 0441 - Progressing by Geri SeminoleJackson, Jamieson Hetland A, RN   Nutrition: Adequate nutrition will be maintained 11/15/2017 0441 - Not Progressing by Geri SeminoleJackson, Aislin Onofre A, RN   Coping: Level of anxiety will decrease 11/15/2017 0441 - Not Progressing by Geri SeminoleJackson, Sharda Keddy A, RN   Elimination: Will not experience complications related to bowel motility 11/15/2017 0441 - Progressing by Geri SeminoleJackson, Annabella Elford A, RN Will not experience complications related to urinary retention 11/15/2017 0441 - Progressing by Geri SeminoleJackson, Dantonio Justen A, RN   Safety: Ability to remain free from injury will improve 11/15/2017 0441 - Progressing by Geri SeminoleJackson, Junelle Hashemi A, RN   Skin Integrity: Risk for impaired skin integrity will decrease 11/15/2017 0441 - Progressing by Geri SeminoleJackson, Syble Picco A, RN   Urinary Elimination: Signs and symptoms of infection will decrease 11/15/2017 0441 - Progressing by Geri SeminoleJackson, Therma Lasure A, RN

## 2017-11-15 NOTE — Care Management Note (Signed)
Case Management Note  Patient Details  Name: Amanda Jacobs MRN: [id number] Date of Birth: 11/12/1931  Subjective/Objective:                 Placed in observation from home for weakness.  Lives with her 82 year old husband and up until this episode of illness, was ambulatory at home and able to perform her adls. Patient does not have HCPOA and if she is mentally clear during this stay, family may like to address this while here.  She has a walker at home and current with her pcp.  PT and slp consults are pending. Family would be agreeable to home health if it is recommended.  She is currently on supplemental oxygen for a room air sat of 83 and 86 %.  She does not have a chronic cardiopulmonary diagnosis.  Chest xray does not show any active cardiopulmonary disease.   Action/Plan:  Need to wean oxygen as able  Expected Discharge Date:                  Expected Discharge Plan:     In-House Referral:     Discharge planning Services     Post Acute Care Choice:    Choice offered to:     DME Arranged:    DME Agency:     HH Arranged:    HH Agency:     Status of Service:     If discussed at [employer] Meetings, dates discussed:    Additional Comments:  Eber HongGreene, Marlies Ligman R, RN 11/15/2017, 9:08 AM

## 2017-11-15 NOTE — Progress Notes (Signed)
Physical Therapy Evaluation Patient Details Name: Amanda Jacobs MRN: [id number] DOB: 04/08/1931 Today's Date: 11/15/2017   History of Present Illness  Amanda Jacobs  is a 84 y.o. female with a known history of new onset diagnosis of dementia, hypertension comes to the emergency room brought in by family for patient being weak difficulty swallowing for last few days and foul-smelling urine.  Clinical Impression  Patient performs bed mobility with min assist for supine to sit and mod assist for sit to supine. She has fair sitting balance and needs BUE to support herself at edge of bed. She performs transfers sit to stand and min assist and RW and has poor static standing balance. She has posterior lean in static stand and is not able to shift her weight over her feet. She is not able to ambulate due to poor standing balance. She has 3/5 strength BLE. She will benefit from skilled PT to improve mobility and strength.     Follow Up Recommendations SNF    Equipment Recommendations  Rolling walker with 5" wheels    Recommendations for Other Services       Precautions / Restrictions Restrictions Weight Bearing Restrictions: No      Mobility  Bed Mobility Overal bed mobility: Needs Assistance(min assist supine to sit and sit to supine mod assist) Bed Mobility: Sit to Supine       Sit to supine: Min assist      Transfers Overall transfer level: Needs assistance Equipment used: Rolling walker (2 wheeled) Transfers: Sit to/from Stand Sit to Stand: Min assist         General transfer comment: needs cues for UE placement  Ambulation/Gait Ambulation/Gait assistance: (unable due to poor static standing balance)              Stairs            Wheelchair Mobility    Modified Rankin (Stroke Patients Only)       Balance Overall balance assessment: Needs assistance Sitting-balance support: Bilateral upper extremity supported;Feet supported Sitting balance-Leahy  Scale: Fair   Postural control: Posterior lean Standing balance support: Bilateral upper extremity supported( bed support behind legs to assist with standing balance) Standing balance-Leahy Scale: Poor Standing balance comment: static standing is poor                             Pertinent Vitals/Pain Pain Assessment: No/denies pain    Home Living Family/patient expects to be discharged to:: Private residence Living Arrangements: Spouse/significant other Available Help at Discharge: Family Type of Home: House Home Access: Stairs to enter Entrance Stairs-Rails: None Secretary/administrator of Steps: 1 Home Layout: One level Home Equipment: Environmental consultant - standard      Prior Function Level of Independence: Independent with assistive device(s)               Hand Dominance        Extremity/Trunk Assessment   Upper Extremity Assessment Upper Extremity Assessment: Overall WFL for tasks assessed    Lower Extremity Assessment Lower Extremity Assessment: Overall WFL for tasks assessed       Communication   Communication: No difficulties  Cognition Arousal/Alertness: Awake/alert Behavior During Therapy: WFL for tasks assessed/performed Overall Cognitive Status: Within Functional Limits for tasks assessed  General Comments      Exercises     Assessment/Plan    PT Assessment Patient needs continued PT services  PT Problem List Decreased strength;Decreased activity tolerance;Decreased balance;Decreased mobility;Decreased safety awareness       PT Treatment Interventions Gait training;Functional mobility training;Therapeutic activities;Therapeutic exercise;Balance training;Neuromuscular re-education;Patient/family education    PT Goals (Current goals can be found in the Care Plan section)  Acute Rehab PT Goals Patient Stated Goal: to ambulate household distances PT Goal Formulation: With patient Time  For Goal Achievement: 11/29/17 Potential to Achieve Goals: Good    Frequency Min 2X/week   Barriers to discharge Decreased caregiver support      Co-evaluation               AM-PAC PT "6 Clicks" Daily Activity  Outcome Measure Difficulty turning over in bed (including adjusting bedclothes, sheets and blankets)?: Unable Difficulty moving from lying on back to sitting on the side of the bed? : Unable Difficulty sitting down on and standing up from a chair with arms (e.g., wheelchair, bedside commode, etc,.)?: Unable Help needed moving to and from a bed to chair (including a wheelchair)?: Total Help needed walking in hospital room?: Total Help needed climbing 3-5 steps with a railing? : Total 6 Click Score: 6    End of Session Equipment Utilized During Treatment: Gait belt;Oxygen Activity Tolerance: Patient tolerated treatment well Patient left: in bed   PT Visit Diagnosis: Unsteadiness on feet (R26.81);Muscle weakness (generalized) (M62.81);Difficulty in walking, not elsewhere classified (R26.2)    Time: [id number] PT Time Calculation (min) (ACUTE ONLY): 25 min   Charges:   PT Evaluation $PT Eval Low Complexity: 1 Low PT Treatments $Therapeutic Activity: 8-22 mins   PT G CodesEzekiel Ina:          Benisha Hadaway S, PT DPT 11/15/2017, 1:08 PM

## 2017-11-15 NOTE — Care Management Obs Status (Signed)
MEDICARE OBSERVATION STATUS NOTIFICATION   Patient Details  Name: Amanda Jacobs MRN: [id number] Date of Birth: 05/08/1931   Medicare Observation Status Notification Given:  Yes Notice signed, one given to patient and the other to HIM for scanning    Amanda Jacobs HongGreene, Padraig Nhan R, RN 11/15/2017, 9:08 AM

## 2017-11-15 NOTE — Progress Notes (Signed)
Chaplain received an order to visit with pt in room 124. Chaplain provided information for an advanced directive.

## 2017-11-15 NOTE — Consult Note (Signed)
Amanda Miniumarren Nelson Noone, MD [facility]  [address] [place], [place] [address] Phone: [phone] Fax : [phone]  Consultation  Referring Provider:     Dr. Renae GlossWieting Primary Care Physician:  Ellyn HackShah, Syed Asad A, MD Primary Gastroenterologist: Gentry FitzUnassigned         Reason for Consultation:     Dysphasia  Date of Admission:  11/09/2017 Date of Consultation:  11/15/2017         HPI:   Amanda Jacobs is a 84 y.o. female who has a history of dementia who was noted to have a significant weight loss in the last year.  The patient's family states that the patient was close to 200 pounds in the past.  The patient has been decreasing p.o. intake and they report she even choked on water.  The patient is now here because of difficulty with swallowing over the last few days and foul-smelling urine.  Patient denies any abdominal pain nausea vomiting fevers or chills.  The patient also reports that she sees Dr. Marguerite OleaSh every 6 weeks and denies having a outpatient workup for her dysphasia.  The patient also denies ever having a colonoscopy in the past.  I am now being asked to see the patient for her weight loss and dysphasia.  Past Medical History:  Diagnosis Date  . Allergy   . Hypertension     Past Surgical History:  Procedure Laterality Date  . CLOSED REDUCTION AND PERCUTANEOUS PINNING OF HUMERUS FRACTURE  1970   hip    Prior to Admission medications   Medication Sig Start Date End Date Taking? Authorizing Provider  acetaminophen (TYLENOL) 500 MG tablet Take 500 mg by mouth every 6 (six) hours as needed.   Yes [provider]  amLODipine (NORVASC) 10 MG tablet Take 1 tablet (10 mg total) by mouth daily. 08/23/17  Yes Ellyn HackShah, Syed Asad A, MD  aspirin EC 81 MG tablet Take 81 mg by mouth at bedtime.    Yes [provider]  atenolol (TENORMIN) 100 MG tablet Take 1 tablet (100 mg total) by mouth daily. 08/23/17  Yes Velta AddisonShah, Syed Asad A, MD  donepezil (ARICEPT) 5 MG tablet TAKE ONE TABLET BY  MOUTH AT BEDTIME. 10/17/17  Yes Ellyn HackShah, Syed Asad A, MD  hydrochlorothiazide (HYDRODIURIL) 25 MG tablet Take 1 tablet (25 mg total) by mouth daily. 08/23/17  Yes Ellyn HackShah, Syed Asad A, MD  meloxicam (MOBIC) 7.5 MG tablet TAKE 1 TABLET TWICE A DAY WITH FOOD 08/30/17  Yes Ellyn HackShah, Syed Asad A, MD    Family History  Problem Relation Age of Onset  . Hypertension Sister   . Diabetes Sister   . Hypertension Brother   . Heart disease Brother   . Cancer Maternal Aunt        colon  . Diabetes Maternal Grandmother      Social History   Tobacco Use  . Smoking status: Never Smoker  . Smokeless tobacco: Never Used  Substance Use Topics  . Alcohol use: No    Alcohol/week: 0.0 oz  . Drug use: No    Allergies as of 11/11/2017 - Review Complete 10/19/2017  Allergen Reaction Noted  . Ace inhibitors  07/01/2015  . Azor  [amlodipine-olmesartan] Swelling 07/01/2015  . Benazepril Cough 07/01/2015    Review of Systems:    All systems reviewed and negative except where noted in HPI.   Physical Exam:  Vital signs in last 24 hours: Temp:  [97.5 F (36.4 C)-98 F (36.7 C)] 98  F (36.7 C) (01/01 1221) Pulse Rate:  [57-76] 58 (01/01 1221) Resp:  [20-30] 20 (01/01 1221) BP: (106-182)/(34-69) 106/34 (01/01 1221) SpO2:  [83 %-99 %] 99 % (01/01 1221) Weight:  [125 lb (56.7 kg)-176 lb (79.8 kg)] 176 lb (79.8 kg) (12/31 1455) Last BM Date: 11/13/2017 General:   Pleasant, cooperative in NAD Head:  Normocephalic and atraumatic. Eyes:   No icterus.   Conjunctiva pink. PERRLA. Ears:  Normal auditory acuity. Neck:  Supple; no masses or thyroidomegaly Lungs: Respirations even and unlabored. Lungs clear to auscultation bilaterally.   No wheezes, crackles, or rhonchi.  Heart:  Regular rate and rhythm;  Without murmur, clicks, rubs or gallops Abdomen:  Soft, nondistended, nontender. Normal bowel sounds. No appreciable masses or hepatomegaly.  No rebound or guarding.  Rectal:  Not performed. Msk:  Symmetrical  without gross deformities.    Extremities:  Without edema, cyanosis or clubbing. Neurologic:  Alert;  grossly normal neurologically. Skin:  Intact without significant lesions or rashes. Cervical Nodes:  No significant cervical adenopathy. Psych:  Alert and cooperative. Normal affect.  LAB RESULTS: Recent Labs    10/22/2017 1514  WBC 7.5  HGB 14.0  HCT 42.8  PLT 198   BMET Recent Labs    11/07/2017 1514  NA 141  K 4.2  CL 104  CO2 28  GLUCOSE 88  BUN 21*  CREATININE 0.85  CALCIUM 9.6   LFT Recent Labs    10/23/2017 1514  PROT 6.8  ALBUMIN 4.0  AST 21  ALT 10*  ALKPHOS 46  BILITOT 1.3*   PT/INR No results for input(s): LABPROT, INR in the last 72 hours.  STUDIES: Dg Chest 2 View  Result Date: 11/02/2017 CLINICAL DATA:  History fevers EXAM: CHEST  2 VIEW COMPARISON:  01/07/2015 FINDINGS: Cardiac shadow is within normal limits. Aortic calcifications are noted. Degenerative changes are noted about the shoulder joints. The lungs are clear bilaterally. IMPRESSION: No active cardiopulmonary disease. Electronically Signed   By: Alcide Clever M.D.   On: 10/24/2017 16:02   Dg Chest Port 1 View  Result Date: 11/15/2017 CLINICAL DATA:  Short of breath and weakness EXAM: PORTABLE CHEST 1 VIEW COMPARISON:  10/31/2017 FINDINGS: Normal heart size. Low volumes. Bibasilar atelectasis. No pneumothorax. No pleural effusion. IMPRESSION: Bibasilar atelectasis. Electronically Signed   By: Jolaine Click M.D.   On: 11/15/2017 11:16      Impression / Plan:   Amanda Jacobs is a 84 y.o. y/o female with a history of dementia and UTI who is reported to have weight loss and difficulty swallowing for the last year which has been worse in the last few weeks.  The patient will be set up for an EGD for tomorrow.  The patient and the family have been explained the plan and agree with it.  Thank you for involving me in the care of this patient.      LOS: 0 days   Amanda Minium, MD  11/15/2017, 1:08  PM   Note: This dictation was prepared with Dragon dictation along with smaller phrase technology. Any transcriptional errors that result from this process are unintentional.

## 2017-11-15 NOTE — Progress Notes (Addendum)
Patient ID: Amanda Jacobs, female   DOB: 1930-12-16, 84 y.o.   MRN: [id number]  [employer] [facility] PROGRESS NOTE  Amanda Jacobs WUJ:[id number] DOB: Nov 30, 1930 DOA: 11/07/2017 PCP: Ellyn Hack, MD  HPI/Subjective: Patient not eating very well.  Patient states that the food gets stuck and she coughs up the food.  She has had a 60 pound weight loss over the past year.  Patient was actually admitted for UTI and weakness.  Objective: Vitals:   11/15/17 1221 11/15/17 1319  BP: (!) 106/34   Pulse: (!) 58   Resp: 20   Temp: 98 F (36.7 C)   SpO2: 99% 97%    Filed Weights   11/03/2017 1454 10/31/2017 1455  Weight: 56.7 kg (125 lb) 79.8 kg (176 lb)    ROS: Review of Systems  Constitutional: Negative for chills and fever.  Eyes: Negative for blurred vision.  Respiratory: Negative for cough and shortness of breath.   Cardiovascular: Negative for chest pain.  Gastrointestinal: Positive for nausea and vomiting. Negative for abdominal pain, constipation and diarrhea.  Genitourinary: Negative for dysuria.  Musculoskeletal: Negative for joint pain.  Neurological: Negative for dizziness and headaches.   Exam: Physical Exam  HENT:  Nose: No mucosal edema.  Mouth/Throat: No oropharyngeal exudate or posterior oropharyngeal edema.  Eyes: Conjunctivae, EOM and lids are normal. Pupils are equal, round, and reactive to light.  Neck: No JVD present. Carotid bruit is not present. No edema present. No thyroid mass and no thyromegaly present.  Cardiovascular: S1 normal and S2 normal. Exam reveals no gallop.  No murmur heard. Pulses:      Dorsalis pedis pulses are 2+ on the right side, and 2+ on the left side.  Respiratory: No respiratory distress. She has decreased breath sounds in the right middle field, the right lower field, the left middle field and the left lower field. She has no wheezes. She has no rhonchi. She has no rales.  Coarse breath sounds in the lungs  GI: Soft. Bowel sounds  are normal. There is no tenderness.  Musculoskeletal:       Right ankle: She exhibits no swelling.       Left ankle: She exhibits swelling.  Lymphadenopathy:    She has no cervical adenopathy.  Neurological: She is alert. No cranial nerve deficit.  Skin: Skin is warm. No rash noted. Nails show no clubbing.  Psychiatric: She has a normal mood and affect.      Data Reviewed: Basic Metabolic Panel: Recent Labs  Lab 10/19/2017 1514  NA 141  K 4.2  CL 104  CO2 28  GLUCOSE 88  BUN 21*  CREATININE 0.85  CALCIUM 9.6   Liver Function Tests: Recent Labs  Lab 11/11/2017 1514  AST 21  ALT 10*  ALKPHOS 46  BILITOT 1.3*  PROT 6.8  ALBUMIN 4.0   CBC: Recent Labs  Lab 11/01/2017 1514  WBC 7.5  NEUTROABS 4.9  HGB 14.0  HCT 42.8  MCV 92.7  PLT 198     Studies: Dg Chest 2 View  Result Date: 10/19/2017 CLINICAL DATA:  History fevers EXAM: CHEST  2 VIEW COMPARISON:  01/07/2015 FINDINGS: Cardiac shadow is within normal limits. Aortic calcifications are noted. Degenerative changes are noted about the shoulder joints. The lungs are clear bilaterally. IMPRESSION: No active cardiopulmonary disease. Electronically Signed   By: Alcide Clever M.D.   On: 10/15/2017 16:02   Dg Chest Port 1 View  Result Date: 11/15/2017 CLINICAL DATA:  Short of  breath and weakness EXAM: PORTABLE CHEST 1 VIEW COMPARISON:  10/20/2017 FINDINGS: Normal heart size. Low volumes. Bibasilar atelectasis. No pneumothorax. No pleural effusion. IMPRESSION: Bibasilar atelectasis. Electronically Signed   By: Jolaine ClickArthur  Hoss M.D.   On: 11/15/2017 11:16    Scheduled Meds: . [START ON 11/29/2017] amLODipine  5 mg Oral Daily  . donepezil  5 mg Oral QHS  . enoxaparin (LOVENOX) injection  40 mg Subcutaneous Q24H  . ipratropium-albuterol  3 mL Nebulization Q6H  . mouth rinse  15 mL Mouth Rinse BID  . pantoprazole (PROTONIX) IV  40 mg Intravenous Q12H  . QUEtiapine  25 mg Oral QHS   Continuous Infusions: . sodium chloride 40  mL/hr at 11/15/17 1143  . cefTRIAXone (ROCEPHIN)  IV      Assessment/Plan:  1. Dysphasia, weight loss, failure to thrive.  Case discussed with gastroenterology for endoscopy for tomorrow to rule out esophageal cancer.  Stop aspirin and Mobic.  Start Protonix. 2. Acute hypoxic respiratory failure with pulse ox of 83% on room air.  Patient not taking good breath.  Start DuoNeb nebulizer solution and budesonide.  Chest x-ray negative x2. 3. Generalized weakness.  Physical therapy recommended rehab. 4. Acute cystitis with hematuria on Rocephin.  Follow-up urine culture 5. Relative hypotension.  Hold antihypertensive medication 6. Dementia on Aricept. 7. Insomnia last night.  Start Seroquel tonight.  Code Status:     Code Status Orders  (From admission, onward)        Start     Ordered   September 02, 2017 1804  Full code  Continuous     September 02, 2017 1803    Code Status History    Date Active Date Inactive Code Status Order ID Comments User Context   This patient has a current code status but no historical code status.     Family Communication: Family at the bedside Disposition Plan: Physical therapy recommended rehab  Time spent: 28 minutes, case discussed with gastroenterology  Alford Highlandichard Zuly Belkin  [employer] [facility]

## 2017-11-15 DEATH — deceased

## 2017-11-16 ENCOUNTER — Encounter: Admission: EM | Disposition: E | Payer: Self-pay | Source: Home / Self Care | Attending: Internal Medicine

## 2017-11-16 ENCOUNTER — Inpatient Hospital Stay: Payer: Medicare Other | Admitting: Anesthesiology

## 2017-11-16 ENCOUNTER — Inpatient Hospital Stay: Payer: Medicare Other

## 2017-11-16 DIAGNOSIS — J9601 Acute respiratory failure with hypoxia: Secondary | ICD-10-CM

## 2017-11-16 HISTORY — PX: ESOPHAGOGASTRODUODENOSCOPY (EGD) WITH PROPOFOL: SHX5813

## 2017-11-16 LAB — BASIC METABOLIC PANEL
Anion gap: 8 (ref 5–15)
BUN: 28 mg/dL — ABNORMAL HIGH (ref 6–20)
CHLORIDE: 107 mmol/L (ref 101–111)
CO2: 27 mmol/L (ref 22–32)
Calcium: 9.4 mg/dL (ref 8.9–10.3)
Creatinine, Ser: 1.05 mg/dL — ABNORMAL HIGH (ref 0.44–1.00)
GFR calc non Af Amer: 47 mL/min — ABNORMAL LOW (ref >60)
GFR, EST AFRICAN AMERICAN: 55 mL/min — AB (ref >60)
Glucose, Bld: 75 mg/dL (ref 65–99)
POTASSIUM: 4.7 mmol/L (ref 3.5–5.1)
SODIUM: 142 mmol/L (ref 135–145)

## 2017-11-16 LAB — GLUCOSE, CAPILLARY: Glucose-Capillary: 86 mg/dL (ref 65–99)

## 2017-11-16 SURGERY — ESOPHAGOGASTRODUODENOSCOPY (EGD) WITH PROPOFOL
Anesthesia: General

## 2017-11-16 MED ORDER — PROPOFOL 500 MG/50ML IV EMUL
INTRAVENOUS | Status: DC | PRN
  Administered 2017-11-16: 100 ug/kg/min via INTRAVENOUS

## 2017-11-16 MED ORDER — PROPOFOL 10 MG/ML IV BOLUS
INTRAVENOUS | Status: DC | PRN
  Administered 2017-11-16: 50 mg via INTRAVENOUS

## 2017-11-16 MED ORDER — METHYLPREDNISOLONE SODIUM SUCC 40 MG IJ SOLR
40.0000 mg | Freq: Every day | INTRAMUSCULAR | Status: DC
  Administered 2017-11-17: 40 mg via INTRAVENOUS
  Filled 2017-11-16: qty 1

## 2017-11-16 MED ORDER — METHYLPREDNISOLONE SODIUM SUCC 125 MG IJ SOLR
80.0000 mg | Freq: Once | INTRAMUSCULAR | Status: AC
  Administered 2017-11-16: 23:00:00 80 mg via INTRAVENOUS
  Filled 2017-11-16: qty 2

## 2017-11-16 MED ORDER — LACTATED RINGERS IV SOLN
INTRAVENOUS | Status: DC | PRN
  Administered 2017-11-16: 12:00:00 via INTRAVENOUS

## 2017-11-16 MED ORDER — ENSURE ENLIVE PO LIQD: 237.0000 mL | Freq: Two times a day (BID) | ORAL | Status: DC

## 2017-11-16 MED ORDER — IPRATROPIUM-ALBUTEROL 0.5-2.5 (3) MG/3ML IN SOLN
3.0000 mL | Freq: Two times a day (BID) | RESPIRATORY_TRACT | Status: DC
  Administered 2017-11-16 – 2017-11-17 (×2): 3 mL via RESPIRATORY_TRACT
  Filled 2017-11-16 (×2): qty 3

## 2017-11-16 NOTE — Consult Note (Signed)
Modified Barium Swallow Progress Note  Patient Details  Name: Amanda Jacobs MRN: [id number] Date of Birth: 12-22-1930  Today's Date: 11/29/2017  Modified Barium Swallow completed.  Full report located under Chart Review in the Imaging Section.  Brief recommendations include the following:  Clinical Impression Pt presents with significant pharyngeal dysphagia, characterized by delayed swallow reflex, reduced anterior laryngeal movement, and absent epiglottic inversion, with subsequent penetration to the level of the vocal folds on thin and nectar thick consistencies. Contact with vocal folds resulted in immediate change in respirations, including stridor, as well as belching noises. Vallecular and pyriform residue was also noted, which further increases aspiration risk. Decreased relaxation of the cricopharyngeus is anticipated, but was difficult to visualize. At this time, it is recommended that pt continue to be NPO, including medications. SLP met with family members to discuss results of MBS and answer questions. MD also informed of results. ST will follow acutely for education. Recommend rehab placement given level of independence and function prior to admit.    Swallow Evaluation Recommendations   Recommended Consults: Consider ENT evaluation   SLP Diet Recommendations: NPO   Medication Administration: Via alternative means   Oral Care Recommendations: Oral care QID   Other Recommendations: Remove water pitcher;Have oral suction available   Melisa Donofrio B. Quentin Ore, [facility], [place] Speech Language Pathologist [phone]  Shonna Chock 11/25/2017,3:15 PM

## 2017-11-16 NOTE — Progress Notes (Signed)
Rapid Response Event Note  Overview:arrived to room pt on NRB due to decrease sats which were noted to be in the 50's per the nurse.pt lying in bed drowsy .       Initial Focused Assessment: pt sats maintained on NRB. Pt was reported to be lying in bed prior to hypoxia   Interventions:MD notified orders received to transfer pt to ICU for further evaluation of hypoxia  Plan of Care (if not transferred): Pt received Chest xray on arrival to unit and placed on bipap Event Summary:   at  2242    at          [facility], [place],Michelle J

## 2017-11-16 NOTE — Progress Notes (Signed)
This patients EGD did not show any obstruction or cause for her trouble swallowing. It is most likely neurological. Nothing further to do from a GI point of view. If the patient's family decides on a PEG tube placement please reconsult us. I will sign off.  Please call if any further GI concerns or questions.  We would like to thank you for the opportunity to participate in the care of Amanda Jacobs.

## 2017-11-16 NOTE — Progress Notes (Signed)
Initial Nutrition Assessment  DOCUMENTATION CODES:   Underweight  INTERVENTION:   Magic cup TID with meals, each supplement provides 290 kcal and 9 grams of protein  Ensure Enlive po BID, each supplement provides 350 kcal and 20 grams of protein  Bowel regimen as needed   NUTRITION DIAGNOSIS:   Inadequate oral intake related to dysphagia as evidenced by per patient/family report.  GOAL:   Patient will meet greater than or equal to 90% of their needs  MONITOR:   PO intake, Supplement acceptance, Labs, Weight trends, I & O's  REASON FOR ASSESSMENT:   Malnutrition Screening Tool    ASSESSMENT:   84 y.o. female who has a history of dementia admitted for dysphagia    Unable to see pt today as pt in procedure at time of RD visit. Pt s/p EGD and CSE today. No abnormalities noted on EGD. SLP recommending NPO and MBS. Per chart, pt with difficulty swallowing and food getting stuck pta. Family reports that pt has not eaten for 8 days. Per chart, pt has lost 19lbs(13%) over the past 10 months; this is not significant given the time frame but is worth noting given pt's history of dementia and dysphagia. RD suspects pt's admit weight is incorrect. RD will obtain nutrition related history and physical exam at follow up. RD will monitor for goals of care and the needs for nutrition support. Spoke to RN, plan for MBS today.    Medications reviewed and include: lovenox, protonix, ceftriaxone, zofran, miralax  Labs reviewed: BUN 28(H), creat 1.05(H)  Unable to complete Nutrition-Focused physical exam at this time.    Diet Order:  Diet regular Room service appropriate? Yes; Fluid consistency: Thin  EDUCATION NEEDS:   Not appropriate for education at this time  Skin:  Reviewed RN Assessment  Last BM:  12/31  Height:   Ht Readings from Last 1 Encounters:  11/13/2017 5\' 6"  (1.676 m)    Weight:   Wt Readings from Last 1 Encounters:  10/30/2017 176 lb (79.8 kg)    Ideal Body  Weight:  59 kg  BMI:  Body mass index is 28.41 kg/m.  Estimated Nutritional Needs:   Kcal:  1400-1600kcal/day   Protein:  85-96g/day   Fluid:  >1.4L/day   Betsey Holidayasey Beauden Tremont MS, RD, LDN Pager #[phone] After Hours Pager: [phone]

## 2017-11-16 NOTE — Anesthesia Preprocedure Evaluation (Addendum)
Anesthesia Evaluation  Patient identified by MRN, date of birth, ID band Patient awake    Reviewed: Allergy & Precautions, H&P , NPO status , Patient's Chart, lab work & pertinent test results, reviewed documented beta blocker date and time   Airway Mallampati: II   Neck ROM: full    Dental  (+) Teeth Intact   Pulmonary neg pulmonary ROS,    Pulmonary exam normal        Cardiovascular Exercise Tolerance: Poor hypertension, On Medications negative cardio ROS Normal cardiovascular exam Rhythm:regular Rate:Normal     Neuro/Psych negative neurological ROS  negative psych ROS   GI/Hepatic negative GI ROS, Neg liver ROS,   Endo/Other  negative endocrine ROS  Renal/GU negative Renal ROS  negative genitourinary   Musculoskeletal   Abdominal   Peds  Hematology negative hematology ROS (+)   Anesthesia Other Findings Past Medical History: No date: Allergy No date: Hypertension Past Surgical History: 1970: CLOSED REDUCTION AND PERCUTANEOUS PINNING OF HUMERUS FRACTURE     Comment:  hip BMI    Body Mass Index:  28.41 kg/m     Reproductive/Obstetrics negative OB ROS                             Anesthesia Physical Anesthesia Plan  ASA: III and emergent  Anesthesia Plan: General   Post-op Pain Management:    Induction:   PONV Risk Score and Plan:   Airway Management Planned:   Additional Equipment:   Intra-op Plan:   Post-operative Plan:   Informed Consent: I have reviewed the patients History and Physical, chart, labs and discussed the procedure including the risks, benefits and alternatives for the proposed anesthesia with the patient or authorized representative who has indicated his/her understanding and acceptance.   Dental Advisory Given  Plan Discussed with: CRNA  Anesthesia Plan Comments:         Anesthesia Quick Evaluation

## 2017-11-16 NOTE — Transfer of Care (Signed)
Immediate Anesthesia Transfer of Care Note  Patient: Amanda Jacobs  Procedure(s) Performed: ESOPHAGOGASTRODUODENOSCOPY (EGD) WITH PROPOFOL (N/A )  Patient Location: PACU  Anesthesia Type:General  Level of Consciousness: awake, alert  and oriented  Airway & Oxygen Therapy: Patient Spontanous Breathing and Patient connected to nasal cannula oxygen  Post-op Assessment: Report given to RN and Post -op Vital signs reviewed and stable  Post vital signs: Reviewed and stable  Last Vitals:  Vitals:   Jun 29, 2018 1225 Jun 29, 2018 1228  BP: (!) 88/43 (!) 109/49  Pulse: 89   Resp: 12   Temp: 37.6 C   SpO2: 98%     Last Pain:  Vitals:   Jun 29, 2018 1223  TempSrc: Temporal  PainSc:          Complications: No apparent anesthesia complications

## 2017-11-16 NOTE — Progress Notes (Signed)
Responded to rapid response earlier. No rt inventions

## 2017-11-16 NOTE — Evaluation (Signed)
Clinical/Bedside Swallow Evaluation Patient Details  Name: Amanda Jacobs MRN: [id number] Date of Birth: [birth year]  Today's Date: 12/05/2017 Time:        Past Medical History:  Past Medical History:  Diagnosis Date  . Allergy   . Hypertension    Past Surgical History:  Past Surgical History:  Procedure Laterality Date  . CLOSED REDUCTION AND PERCUTANEOUS PINNING OF HUMERUS FRACTURE  1970   hip   HPI:  84 year old female admitted Jun 25, 2017 with generalized weakness. PMH significant for dementia, HTN, UTI, difficulty swallowing, weight loss. CXR = BLL atelectasis, no active disease.    Assessment / Plan / Recommendation Clinical Impression  Pt presents with history of difficulty swallowing, stating food/liquid "gets stuck". Oral motor strength and function appear adequate. Pt accepted trials of ice chips, water, and puree. Ice chips were tolerated well without overt s/s aspiration or change in respiration. Small sip of water caused immediate stridor and difficulty breathing. RN was called to room. After several minutes, pt respirations returned to normal. Small bolus of puree was presented, with similar response by pt. Pt indicated the bolus was "stuck right here" pointing to her neck, right between her collarbones.  PO presentations were discontinued at this point. MD arrived to pt room, where multiple family members had gathered. Given response to po trials, objective assessment is recommended to identify nature and extent of pharyngeal issue, as well as identification of appropriate course of action and least restrictive diet. MBS scheduled for this afternoon. Report to follow. MD and RN, as well as family made aware. SLP Visit Diagnosis: Dysphagia, unspecified (R13.10)    Aspiration Risk  Severe aspiration risk;Moderate aspiration risk    Diet Recommendation NPO        Other  Recommendations     Follow up Recommendations Skilled Nursing facility;Inpatient Rehab       Frequency and Duration min 1 x/week  1 week;2 weeks       Prognosis Prognosis for Safe Diet Advancement: Fair Barriers to Reach Goals: Cognitive deficits      Swallow Study   General Date of Onset: Jun 25, 2017 HPI: 84 year old female admitted Jun 25, 2017 with generalized weakness. PMH significant for dementia, HTN, UTI, difficulty swallowing, weight loss. CXR = BLL atelectasis, no active disease.  Type of Study: Bedside Swallow Evaluation Previous Swallow Assessment: none found Diet Prior to this Study: NPO Temperature Spikes Noted: No Respiratory Status: Nasal cannula History of Recent Intubation: No Behavior/Cognition: Alert;Cooperative;Pleasant mood Oral Cavity Assessment: Within Functional Limits Oral Care Completed by SLP: Yes Vision: Functional for self-feeding Self-Feeding Abilities: Needs set up;Needs assist Patient Positioning: Upright in bed Baseline Vocal Quality: Low vocal intensity Volitional Cough: Weak Volitional Swallow: Able to elicit    Oral/Motor/Sensory Function Overall Oral Motor/Sensory Function: Within functional limits   Ice Chips Ice chips: Within functional limits Presentation: Spoon   Thin Liquid Thin Liquid: Impaired Pharyngeal  Phase Impairments: Change in Vital Signs;Cough - Immediate    Nectar Thick Nectar Thick Liquid: Not tested   Honey Thick Honey Thick Liquid: Not tested   Puree Puree: Impaired Presentation: Spoon Pharyngeal Phase Impairments: Change in Vital Signs;Cough - Immediate   Solid   GO   Solid: Not tested       Janautica Netzley B. Murvin NatalBueche, [facility] ServicesMSP, CCC-SLP Speech Language Pathologist [phone]  Leigh AuroraBueche, Tyliah Schlereth Brown 12/06/2017,2:58 PM

## 2017-11-16 NOTE — Op Note (Signed)
[facility] Gastroenterology Patient Name: Amanda Jacobs Procedure Date: 11/17/2017 12:09 PM MRN: [id number] Account #: [id number] Date of Birth: March 08, 1931 Admit Type: Inpatient Age: 84 Room: [facility] ENDO ROOM 4 Gender: Female Note Status: Finalized Procedure:            Upper GI endoscopy Indications:          Dysphagia Providers:            Midge Minium MD, MD Referring MD:         Shelbie Ammons. Sherryll Burger (Referring MD) Medicines:            Propofol per Anesthesia Complications:        No immediate complications. Procedure:            Pre-Anesthesia Assessment:                       - Prior to the procedure, a History and Physical was                        performed, and patient medications and allergies were                        reviewed. The patient's tolerance of previous                        anesthesia was also reviewed. The risks and benefits of                        the procedure and the sedation options and risks were                        discussed with the patient. All questions were                        answered, and informed consent was obtained. Prior                        Anticoagulants: The patient has taken no previous                        anticoagulant or antiplatelet agents. ASA Grade                        Assessment: II - A patient with mild systemic disease.                        After reviewing the risks and benefits, the patient was                        deemed in satisfactory condition to undergo the                        procedure.                       After obtaining informed consent, the endoscope was                        passed under direct vision. Throughout the procedure,  the patient's blood pressure, pulse, and oxygen                        saturations were monitored continuously. The Endoscope                        was introduced through the mouth, and advanced to the                        second part  of duodenum. The upper GI endoscopy was                        accomplished without difficulty. The patient tolerated                        the procedure well. Findings:      The examined esophagus was normal.      The stomach was normal.      The examined duodenum was normal. Impression:           - Normal esophagus.                       - Normal stomach.                       - Normal examined duodenum.                       - No specimens collected. Recommendation:       - Return patient to hospital ward for ongoing care.                       - Resume previous diet.                       - Continue present medications. Procedure Code(s):    --- Professional ---                       [phone], Esophagogastroduodenoscopy, flexible, transoral;                        diagnostic, including collection of specimen(s) by                        brushing or washing, when performed (separate procedure) Diagnosis Code(s):    --- Professional ---                       R13.10, Dysphagia, unspecified CPT copyright 2016 American Medical Association. All rights reserved. The codes documented in this report are preliminary and upon coder review may  be revised to meet current compliance requirements. Midge Miniumarren Greenly Rarick MD, MD 12/03/2017 12:19:22 PM This report has been signed electronically. Number of Addenda: 0 Note Initiated On: 11/26/2017 12:09 PM      [facility]

## 2017-11-16 NOTE — Consult Note (Signed)
Name: Amanda Jacobs MRN: [id number] DOB: 1931-10-30    ADMISSION DATE:  10/15/2017 CONSULTATION DATE: 11/16/2017  REFERRING MD : Dr. Duane Boston  CHIEF COMPLAINT: Acute Respiratory Failure   BRIEF PATIENT DESCRIPTION:  84 yo female admitted to medsurg unit 10/21/2017 with generalized weakness, dysphagia, and UTI required transfer to the stepdown unit 11/16/17 with acute hypoxic respiratory failure likely secondary to aspiration   SIGNIFICANT EVENTS  12/31-Pt admitted to [facility] unit 01/2-Pt transferred to stepdown unit with acute hypoxic respiratory failure   STUDIES:  Modified Barium Swallow 01/2>>significant vocal cord paralysis resulting in dysphagia  Upper Endoscopy 01/2>>no obstruction or cause for dysphagia revealed   HISTORY OF PRESENT ILLNESS:   This is an 84 yo female with a PMH of HTN, New Onset Diagnosis of Dementia, and Allergies.  She presented to [facility] ER 12/31 with generalized weakness, difficulty swallowing, poor po intake, and foul smelling urine onset of symptoms a few days prior to presentation to the ER. In the ER urinalysis results positive for UTI.  She was subsequently admitted to the [facility] unit by hospitalist team for further workup and treatment.  She underwent a EGD on 01/2 that was negative for an obstruction.  She also had modified barium swallow on 01/2 that revealed vocal cord paralysis, therefore ENT consulted for PEG tube placement.  She required transfer to the stepdown unit on 01/2 due to acute hypoxic respiratory failure.    PAST MEDICAL HISTORY :   has a past medical history of Allergy and Hypertension.  has a past surgical history that includes Closed reduction and percutaneous pinning of humerus fracture (1970). Prior to Admission medications   Medication Sig Start Date End Date Taking? Authorizing Provider  acetaminophen (TYLENOL) 500 MG tablet Take 500 mg by mouth every 6 (six) hours as needed.   Yes [provider]  amLODipine (NORVASC) 10  MG tablet Take 1 tablet (10 mg total) by mouth daily. 08/23/17  Yes Roselee Nova, MD  aspirin EC 81 MG tablet Take 81 mg by mouth at bedtime.    Yes [provider]  atenolol (TENORMIN) 100 MG tablet Take 1 tablet (100 mg total) by mouth daily. 08/23/17  Yes Rochel Brome A, MD  donepezil (ARICEPT) 5 MG tablet TAKE ONE TABLET BY MOUTH AT BEDTIME. 10/17/17  Yes Roselee Nova, MD  hydrochlorothiazide (HYDRODIURIL) 25 MG tablet Take 1 tablet (25 mg total) by mouth daily. 08/23/17  Yes Roselee Nova, MD  meloxicam (MOBIC) 7.5 MG tablet TAKE 1 TABLET TWICE A DAY WITH FOOD 08/30/17  Yes Roselee Nova, MD   Allergies  Allergen Reactions  . Ace Inhibitors     Angioedema  . Azor  [Amlodipine-Olmesartan] Swelling  . Benazepril Cough    FAMILY HISTORY:  family history includes Cancer in her maternal aunt; Diabetes in her maternal grandmother and sister; Heart disease in her brother; Hypertension in her brother and sister. SOCIAL HISTORY:  reports that  has never smoked. she has never used smokeless tobacco. She reports that she does not drink alcohol or use drugs.  REVIEW OF SYSTEMS:   Unable to assess NRB in place   SUBJECTIVE:  Pt currently in mild respiratory distress with NRB in place   VITAL SIGNS: Temp:  [97.5 F (36.4 C)-99.7 F (37.6 C)] 98.8 F (37.1 C) (01/02 2048) Pulse Rate:  [68-89] 73 (01/02 2048) Resp:  [12-29] 17 (01/02 2048) BP: (88-147)/(30-60) 147/57 (01/02 2048) SpO2:  [93 %-100 %]  98 % (01/02 2048)  PHYSICAL EXAMINATION: General: chronically ill appearing female in mild respiratory distress  Neuro: lethargic, follows commands  HEENT: supple, no JVD  Cardiovascular: sinus tach, no M/R/G Lungs: diminished throughout, shallow respirations mildly tachypneic  Abdomen: +BS x4, soft, non distended  Musculoskeletal: normal bulk and tone, trace bilateral lower extremity edema  Skin: skin tear present on coccyx   Recent Labs  Lab 10/17/2017 1514  11/19/2017 0513  NA 141 142  K 4.2 4.7  CL 104 107  CO2 28 27  BUN 21* 28*  CREATININE 0.85 1.05*  GLUCOSE 88 75   Recent Labs  Lab 10/20/2017 1514  HGB 14.0  HCT 42.8  WBC 7.5  PLT 198   Dg Chest Port 1 View  Result Date: 11/15/2017 CLINICAL DATA:  Short of breath and weakness EXAM: PORTABLE CHEST 1 VIEW COMPARISON:  10/29/2017 FINDINGS: Normal heart size. Low volumes. Bibasilar atelectasis. No pneumothorax. No pleural effusion. IMPRESSION: Bibasilar atelectasis. Electronically Signed   By: Marybelle Killings M.D.   On: 11/15/2017 11:16   Dg Swallowing Func-speech Pathology  Result Date: 11/28/2017 Objective Swallowing Evaluation: Type of Study: MBS-Modified Barium Swallow Study  Patient Details Name: Amanda Jacobs MRN: [id number] Date of Birth: Mar 08, 1931 Today's Date: 12/07/2017 Time: SLP Start Time (ACUTE ONLY): 7867 -SLP Stop Time (ACUTE ONLY): 1500 SLP Time Calculation (min) (ACUTE ONLY): 45 min Past Medical History: Past Medical History: Diagnosis Date . Allergy  . Hypertension  Past Surgical History: Past Surgical History: Procedure Laterality Date . CLOSED REDUCTION AND PERCUTANEOUS PINNING OF HUMERUS FRACTURE  1970  hip HPI: 84 year old female admitted 10/18/2017 with generalized weakness. PMH significant for dementia, HTN, UTI, difficulty swallowing, weight loss. CXR = BLL atelectasis, no active disease.  Subjective: Pt seen in radiology for MBS Assessment / Plan / Recommendation CHL IP CLINICAL IMPRESSIONS 12/01/2017 Clinical Impression Pt presents with significant pharyngeal dysphagia, characterized by delayed swallow reflex, reduced anterior laryngeal movement, and absent epiglottic inversion, with subsequent penetration to the level of the vocal folds on thin and nectar thick consistencies. Contact with vocal folds resulted in immediate change in respirations, including stridor, as well as belching noises. Vallecular and pyriform residue was also noted, which further increases aspiration risk.  Decreased relaxation of the cricopharyngeus is anticipated, but was difficult to visualize. At this time, it is recommended that pt continue to be NPO, including medications. SLP met with family members to discuss results of MBS and answer questions. MD also informed of results. ST will follow acutely for education. Recommend rehab placement given level of independence and function prior to admit.  SLP Visit Diagnosis Dysphagia, pharyngoesophageal phase (R13.14) Impact on safety and function Severe aspiration risk   CHL IP TREATMENT RECOMMENDATION 11/20/2017 Treatment Recommendations Therapy as outlined in treatment plan below   Prognosis 12/10/2017 Prognosis for Safe Diet Advancement Fair Barriers to Reach Goals Cognitive deficits CHL IP DIET RECOMMENDATION 12/01/2017 SLP Diet Recommendations NPO Medication Administration Via alternative means   CHL IP OTHER RECOMMENDATIONS 12/03/2017 Recommended Consults Consider ENT evaluation Oral Care Recommendations Oral care QID Other Recommendations Remove water pitcher;Have oral suction available   CHL IP FOLLOW UP RECOMMENDATIONS 11/24/2017 Follow up Recommendations Skilled Nursing facility;Inpatient Rehab   CHL IP FREQUENCY AND DURATION 12/03/2017 Speech Therapy Frequency (ACUTE ONLY) min 1 x/week Treatment Duration 1 week;2 weeks      CHL IP ORAL PHASE 11/15/2017 Oral Phase WFL  CHL IP PHARYNGEAL PHASE 12/05/2017 Pharyngeal Phase Impaired Pharyngeal- Nectar Teaspoon Delayed swallow initiation-vallecula;Penetration/Aspiration before  swallow;Penetration/Aspiration during swallow;Penetration/Apiration after swallow;Pharyngeal residue - pyriform;Pharyngeal residue - valleculae;Reduced epiglottic inversion;Reduced pharyngeal peristalsis;Reduced airway/laryngeal closure;Reduced anterior laryngeal mobility;Reduced tongue base retraction Pharyngeal Material enters airway, CONTACTS cords and not ejected out Pharyngeal- Thin Teaspoon Delayed swallow initiation-vallecula;Penetration/Aspiration  before swallow;Penetration/Aspiration during swallow;Penetration/Apiration after swallow;Pharyngeal residue - pyriform;Pharyngeal residue - valleculae;Reduced anterior laryngeal mobility;Reduced epiglottic inversion;Reduced airway/laryngeal closure;Reduced tongue base retraction Pharyngeal Material enters airway, CONTACTS cords and not ejected out Pharyngeal- Puree Delayed swallow initiation-vallecula;Reduced epiglottic inversion;Reduced anterior laryngeal mobility;Reduced tongue base retraction;Pharyngeal residue - pyriform;Pharyngeal residue - valleculae  CHL IP CERVICAL ESOPHAGEAL PHASE 11/19/2017 Cervical Esophageal Phase Impaired Nectar Teaspoon Reduced cricopharyngeal relaxation Thin Teaspoon Reduced cricopharyngeal relaxation Puree Reduced cricopharyngeal relaxation Celia B. Quentin Ore, [facility], CCC-SLP Speech Language Pathologist [phone] Shonna Chock 12/01/2017, 3:06 PM               ASSESSMENT / PLAN: Acute hypoxic respiratory failure likely secondary to aspiration  Dysphagia UTI  Acute renal failure P: Prn Bipap for dyspnea and/or hypoxia Maintain O2 sats >92% Continue scheduled bronchodilator therapy and nebulized steroids  IV steroids daily  Prn CXR Keep NPO-plans for PEG tube placement on 11/17/2017 Trend WBC and monitor fever curve  Follow urine culture Continue ceftriaxone Trend BMP Replace electrolytes as indicated  Monitor UOP  CBG's q4hrs  Once respiratory status improves continue PT evaluation and treatment Palliative Care consulted to discuss goals of treatment    Marda Stalker, [place] Pager [phone] (please enter 7 digits) PCCM Consult Pager [phone] (please enter 7 digits)

## 2017-11-16 NOTE — Anesthesia Post-op Follow-up Note (Signed)
Anesthesia QCDR form completed.        

## 2017-11-16 NOTE — Progress Notes (Signed)
Patient reported difficulty breathing. O2 is 98% on 2L via Nasal cannula. MD notified. Will continue to monitor.

## 2017-11-16 NOTE — Progress Notes (Signed)
Patient ID: Amanda Jacobs, female   DOB: June 08, 1931, 84 y.o.   MRN: [id number]  [employer] PROGRESS NOTE  Amanda Jacobs HCW:[id number] DOB: Jul 16, 1931 DOA: 11/03/2017 PCP: Roselee Nova, MD  HPI/Subjective: Patient had a EGD which showed no obstruction still having significant dysphagia  Objective: Vitals:   12/07/2017 1228 11/24/2017 1446  BP: (!) 109/49 92/60  Pulse:  79  Resp:  18  Temp:  97.9 F (36.6 C)  SpO2:  93%    Filed Weights   11/04/2017 1454 11/05/2017 1455  Weight: 125 lb (56.7 kg) 176 lb (79.8 kg)    ROS: Review of Systems  Constitutional: Negative for chills and fever.  Eyes: Negative for blurred vision.  Respiratory: Negative for cough and shortness of breath.   Cardiovascular: Negative for chest pain.  Gastrointestinal: Positive for nausea and vomiting. Negative for abdominal pain, constipation and diarrhea.  Genitourinary: Negative for dysuria.  Musculoskeletal: Negative for joint pain.  Neurological: Negative for dizziness and headaches.   Exam: Physical Exam  HENT:  Nose: No mucosal edema.  Mouth/Throat: No oropharyngeal exudate or posterior oropharyngeal edema.  Eyes: Conjunctivae, EOM and lids are normal. Pupils are equal, round, and reactive to light.  Neck: No JVD present. Carotid bruit is not present. No edema present. No thyroid mass and no thyromegaly present.  Cardiovascular: S1 normal and S2 normal. Exam reveals no gallop.  No murmur heard. Pulses:      Dorsalis pedis pulses are 2+ on the right side, and 2+ on the left side.  Respiratory: No respiratory distress. She has decreased breath sounds in the right middle field, the right lower field, the left middle field and the left lower field. She has no wheezes. She has no rhonchi. She has no rales.  Coarse breath sounds in the lungs  GI: Soft. Bowel sounds are normal. There is no tenderness.  Musculoskeletal:       Right ankle: She exhibits no swelling.       Left ankle: She  exhibits swelling.  Lymphadenopathy:    She has no cervical adenopathy.  Neurological: She is alert. No cranial nerve deficit.  Skin: Skin is warm. No rash noted. Nails show no clubbing.  Psychiatric: She has a normal mood and affect.      Data Reviewed: Basic Metabolic Panel: Recent Labs  Lab 10/27/2017 1514 11/29/2017 0513  NA 141 142  K 4.2 4.7  CL 104 107  CO2 28 27  GLUCOSE 88 75  BUN 21* 28*  CREATININE 0.85 1.05*  CALCIUM 9.6 9.4   Liver Function Tests: Recent Labs  Lab 11/01/2017 1514  AST 21  ALT 10*  ALKPHOS 46  BILITOT 1.3*  PROT 6.8  ALBUMIN 4.0   CBC: Recent Labs  Lab 11/01/2017 1514  WBC 7.5  NEUTROABS 4.9  HGB 14.0  HCT 42.8  MCV 92.7  PLT 198     Studies: Dg Chest Port 1 View  Result Date: 11/15/2017 CLINICAL DATA:  Short of breath and weakness EXAM: PORTABLE CHEST 1 VIEW COMPARISON:  11/02/2017 FINDINGS: Normal heart size. Low volumes. Bibasilar atelectasis. No pneumothorax. No pleural effusion. IMPRESSION: Bibasilar atelectasis. Electronically Signed   By: Marybelle Killings M.D.   On: 11/15/2017 11:16   Dg Swallowing Func-speech Pathology  Result Date: 12/02/2017 Objective Swallowing Evaluation: Type of Study: MBS-Modified Barium Swallow Study  Patient Details Name: Amanda Jacobs MRN: [id number] Date of Birth: 1931/06/28 Today's Date: 12/01/2017 Time: SLP Start Time (ACUTE ONLY): 1062 -SLP Stop  Time (ACUTE ONLY): 1500 SLP Time Calculation (min) (ACUTE ONLY): 45 min Past Medical History: Past Medical History: Diagnosis Date . Allergy  . Hypertension  Past Surgical History: Past Surgical History: Procedure Laterality Date . CLOSED REDUCTION AND PERCUTANEOUS PINNING OF HUMERUS FRACTURE  1970  hip HPI: 84 year old female admitted 11/04/2017 with generalized weakness. PMH significant for dementia, HTN, UTI, difficulty swallowing, weight loss. CXR = BLL atelectasis, no active disease.  Subjective: Pt seen in radiology for MBS Assessment / Plan / Recommendation CHL IP  CLINICAL IMPRESSIONS 12/11/2017 Clinical Impression Pt presents with significant pharyngeal dysphagia, characterized by delayed swallow reflex, reduced anterior laryngeal movement, and absent epiglottic inversion, with subsequent penetration to the level of the vocal folds on thin and nectar thick consistencies. Contact with vocal folds resulted in immediate change in respirations, including stridor, as well as belching noises. Vallecular and pyriform residue was also noted, which further increases aspiration risk. Decreased relaxation of the cricopharyngeus is anticipated, but was difficult to visualize. At this time, it is recommended that pt continue to be NPO, including medications. SLP met with family members to discuss results of MBS and answer questions. MD also informed of results. ST will follow acutely for education. Recommend rehab placement given level of independence and function prior to admit.  SLP Visit Diagnosis Dysphagia, pharyngoesophageal phase (R13.14) Impact on safety and function Severe aspiration risk   CHL IP TREATMENT RECOMMENDATION 12/14/2017 Treatment Recommendations Therapy as outlined in treatment plan below   Prognosis 12/04/2017 Prognosis for Safe Diet Advancement Fair Barriers to Reach Goals Cognitive deficits CHL IP DIET RECOMMENDATION 12/14/2017 SLP Diet Recommendations NPO Medication Administration Via alternative means   CHL IP OTHER RECOMMENDATIONS 12/14/2017 Recommended Consults Consider ENT evaluation Oral Care Recommendations Oral care QID Other Recommendations Remove water pitcher;Have oral suction available   CHL IP FOLLOW UP RECOMMENDATIONS 12/14/2017 Follow up Recommendations Skilled Nursing facility;Inpatient Rehab   CHL IP FREQUENCY AND DURATION 12/10/2017 Speech Therapy Frequency (ACUTE ONLY) min 1 x/week Treatment Duration 1 week;2 weeks      CHL IP ORAL PHASE 11/29/2017 Oral Phase WFL  CHL IP PHARYNGEAL PHASE 11/22/2017 Pharyngeal Phase Impaired Pharyngeal- Nectar Teaspoon Delayed  swallow initiation-vallecula;Penetration/Aspiration before swallow;Penetration/Aspiration during swallow;Penetration/Apiration after swallow;Pharyngeal residue - pyriform;Pharyngeal residue - valleculae;Reduced epiglottic inversion;Reduced pharyngeal peristalsis;Reduced airway/laryngeal closure;Reduced anterior laryngeal mobility;Reduced tongue base retraction Pharyngeal Material enters airway, CONTACTS cords and not ejected out Pharyngeal- Thin Teaspoon Delayed swallow initiation-vallecula;Penetration/Aspiration before swallow;Penetration/Aspiration during swallow;Penetration/Apiration after swallow;Pharyngeal residue - pyriform;Pharyngeal residue - valleculae;Reduced anterior laryngeal mobility;Reduced epiglottic inversion;Reduced airway/laryngeal closure;Reduced tongue base retraction Pharyngeal Material enters airway, CONTACTS cords and not ejected out Pharyngeal- Puree Delayed swallow initiation-vallecula;Reduced epiglottic inversion;Reduced anterior laryngeal mobility;Reduced tongue base retraction;Pharyngeal residue - pyriform;Pharyngeal residue - valleculae  CHL IP CERVICAL ESOPHAGEAL PHASE 12/14/2017 Cervical Esophageal Phase Impaired Nectar Teaspoon Reduced cricopharyngeal relaxation Thin Teaspoon Reduced cricopharyngeal relaxation Puree Reduced cricopharyngeal relaxation Celia B. Quentin Ore [facility] [place] Speech Language Pathologist [phone] Shonna Chock 12/10/2017, 3:06 PM               Scheduled Meds: . budesonide (PULMICORT) nebulizer solution  0.5 mg Nebulization BID  . donepezil  5 mg Oral QHS  . enoxaparin (LOVENOX) injection  40 mg Subcutaneous Q24H  . ipratropium-albuterol  3 mL Nebulization BID  . mouth rinse  15 mL Mouth Rinse BID  . pantoprazole (PROTONIX) IV  40 mg Intravenous Q12H  . QUEtiapine  25 mg Oral QHS   Continuous Infusions: . cefTRIAXone (ROCEPHIN)  IV 1 g (12/03/2017 1708)  Assessment/Plan:  1. Dysphasia, weight loss, failure to thrive.  Patient had a EGD which shows no  evidence of obstruction or stenosis, patient had a modified barium swallow study which shows that she has significant vocal cord paralysis resulting in dysphasia.  I have discussed with the family regarding nutrition.  The have agreed to have PEG tube placement we have notify GI of this decision 2. Acute hypoxic respiratory failure with pulse ox of 83% on room air.  Patient not taking good breath.  Continue duo nebs 3. Generalized weakness.  Physical therapy recommended rehab. 4. UTI with E. coli continue ceftriaxone for now 5. Relative hypotension.  Hold antihypertensive medication 6. Dementia on Aricept. 7. Insomnia last night.  Start Seroquel tonight.  Code Status:     Code Status Orders  (From admission, onward)        Start     Ordered   11/11/2017 1804  Full code  Continuous     10/17/2017 1803    Code Status History    Date Active Date Inactive Code Status Order ID Comments User Context   This patient has a current code status but no historical code status.     Family Communication: Family at the bedside Disposition Plan: Physical therapy recommended rehab  Time spent: 28 minutes, case discussed with gastroenterology  Posey Pronto, [facility]

## 2017-11-16 NOTE — Consult Note (Signed)
SLP Cancellation Note  Patient Details Name: Amanda Jacobs MRN: [id number] DOB: 1931/01/13   Cancelled treatment:       Reason Eval/Treat Not Completed: Other (comment): Pt is currently NPO for EGD today per MD notes. Chart reviewed and history taken. Based on pt symptoms ("food gets stuck"), the primary issue is likely to be esophageal in nature, rather than oropharyngeal, however, pt is at risk for dysphagia and aspiration given advanced age and dx of dementia.   ST will await results from EGD and proceed accordingly. Please call if needs arise in the interim ([phone]).    Rabiah Goeser B. Murvin NatalBueche, [facility] CenterMSP, CCC-SLP Speech Language Pathologist [phone]  Leigh AuroraBueche, Nolyn Eilert Brown 12/12/2017, 9:35 AM

## 2017-11-17 ENCOUNTER — Encounter: Admission: EM | Disposition: E | Payer: Self-pay | Source: Home / Self Care | Attending: Internal Medicine

## 2017-11-17 ENCOUNTER — Encounter: Payer: Self-pay | Admitting: Registered Nurse

## 2017-11-17 ENCOUNTER — Encounter: Payer: Self-pay | Admitting: Gastroenterology

## 2017-11-17 DIAGNOSIS — N390 Urinary tract infection, site not specified: Secondary | ICD-10-CM

## 2017-11-17 DIAGNOSIS — R0902 Hypoxemia: Secondary | ICD-10-CM

## 2017-11-17 DIAGNOSIS — R451 Restlessness and agitation: Secondary | ICD-10-CM

## 2017-11-17 DIAGNOSIS — Z515 Encounter for palliative care: Secondary | ICD-10-CM

## 2017-11-17 DIAGNOSIS — Z7189 Other specified counseling: Secondary | ICD-10-CM

## 2017-11-17 LAB — CBC WITH DIFFERENTIAL/PLATELET
Basophils Absolute: 0 10*3/uL (ref 0–0.1)
Basophils Relative: 0 %
EOS ABS: 0 10*3/uL (ref 0–0.7)
Eosinophils Relative: 0 %
HEMATOCRIT: 40 % (ref 35.0–47.0)
HEMOGLOBIN: 13.1 g/dL (ref 12.0–16.0)
LYMPHS ABS: 0.4 10*3/uL — AB (ref 1.0–3.6)
LYMPHS PCT: 5 %
MCH: 30.4 pg (ref 26.0–34.0)
MCHC: 32.6 g/dL (ref 32.0–36.0)
MCV: 93.2 fL (ref 80.0–100.0)
Monocytes Absolute: 0.1 10*3/uL — ABNORMAL LOW (ref 0.2–0.9)
Monocytes Relative: 1 %
NEUTROS ABS: 7 10*3/uL — AB (ref 1.4–6.5)
NEUTROS PCT: 94 %
Platelets: 177 10*3/uL (ref 150–440)
RBC: 4.29 MIL/uL (ref 3.80–5.20)
RDW: 13.8 % (ref 11.5–14.5)
WBC: 7.5 10*3/uL (ref 3.6–11.0)

## 2017-11-17 LAB — BASIC METABOLIC PANEL
Anion gap: 12 (ref 5–15)
BUN: 27 mg/dL — AB (ref 6–20)
CHLORIDE: 106 mmol/L (ref 101–111)
CO2: 24 mmol/L (ref 22–32)
Calcium: 9.6 mg/dL (ref 8.9–10.3)
Creatinine, Ser: 0.93 mg/dL (ref 0.44–1.00)
GFR calc Af Amer: >60 mL/min (ref >60)
GFR calc non Af Amer: 55 mL/min — ABNORMAL LOW (ref >60)
Glucose, Bld: 142 mg/dL — ABNORMAL HIGH (ref 65–99)
POTASSIUM: 4.4 mmol/L (ref 3.5–5.1)
SODIUM: 142 mmol/L (ref 135–145)

## 2017-11-17 LAB — URINE CULTURE: Culture: >=100000 — AB

## 2017-11-17 LAB — MRSA PCR SCREENING: MRSA by PCR: NEGATIVE

## 2017-11-17 LAB — PROTIME-INR
INR: 1.03
PROTHROMBIN TIME: 13.4 s (ref 11.4–15.2)

## 2017-11-17 LAB — GLUCOSE, CAPILLARY
GLUCOSE-CAPILLARY: 121 mg/dL — AB (ref 65–99)
GLUCOSE-CAPILLARY: 90 mg/dL (ref 65–99)
Glucose-Capillary: 116 mg/dL — ABNORMAL HIGH (ref 65–99)

## 2017-11-17 LAB — APTT: aPTT: 24 seconds (ref 24–36)

## 2017-11-17 SURGERY — INSERTION, PEG TUBE
Anesthesia: General

## 2017-11-17 MED ORDER — LORAZEPAM 2 MG/ML IJ SOLN
1.0000 mg | INTRAMUSCULAR | Status: DC | PRN
  Administered 2017-11-17: 21:00:00 1 mg via INTRAVENOUS
  Filled 2017-11-17: qty 1

## 2017-11-17 MED ORDER — CEFAZOLIN SODIUM-DEXTROSE 1-4 GM/50ML-% IV SOLN
1.0000 g | Freq: Three times a day (TID) | INTRAVENOUS | Status: DC
  Filled 2017-11-17 (×2): qty 50

## 2017-11-17 MED ORDER — SODIUM CHLORIDE 0.9% FLUSH
3.0000 mL | Freq: Two times a day (BID) | INTRAVENOUS | Status: DC
  Administered 2017-11-17 – 2017-11-18 (×4): 3 mL via INTRAVENOUS

## 2017-11-17 MED ORDER — GLYCOPYRROLATE 0.2 MG/ML IJ SOLN
0.2000 mg | INTRAMUSCULAR | Status: DC | PRN
  Filled 2017-11-17: qty 1

## 2017-11-17 MED ORDER — ONDANSETRON HCL 4 MG/2ML IJ SOLN: 4.0000 mg | Freq: Four times a day (QID) | INTRAMUSCULAR | Status: DC | PRN

## 2017-11-17 MED ORDER — SODIUM CHLORIDE 0.9% FLUSH: 3.0000 mL | INTRAVENOUS | Status: DC | PRN

## 2017-11-17 MED ORDER — MORPHINE SULFATE (PF) 2 MG/ML IV SOLN
2.0000 mg | INTRAVENOUS | Status: DC | PRN
  Administered 2017-11-17 – 2017-11-18 (×3): 2 mg via INTRAVENOUS
  Filled 2017-11-17 (×3): qty 1

## 2017-11-17 MED ORDER — CEFAZOLIN SODIUM 1 G IJ SOLR
1000.0000 mg | Freq: Three times a day (TID) | INTRAMUSCULAR | Status: DC
  Filled 2017-11-17 (×2): qty 10

## 2017-11-17 MED ORDER — SODIUM CHLORIDE 0.9 % IV SOLN: 250.0000 mL | INTRAVENOUS | Status: DC | PRN

## 2017-11-17 MED ORDER — ONDANSETRON 4 MG PO TBDP
4.0000 mg | ORAL_TABLET | Freq: Four times a day (QID) | ORAL | Status: DC | PRN
  Filled 2017-11-17: qty 1

## 2017-11-17 MED ORDER — LORAZEPAM 2 MG/ML PO CONC
1.0000 mg | ORAL | Status: DC | PRN
  Administered 2017-11-18: 11:00:00 1 mg via SUBLINGUAL
  Filled 2017-11-17: qty 1
  Filled 2017-11-17: qty 0.5

## 2017-11-17 MED ORDER — BIOTENE DRY MOUTH MT LIQD: 15.0000 mL | OROMUCOSAL | Status: DC | PRN

## 2017-11-17 MED ORDER — MORPHINE SULFATE (CONCENTRATE) 10 MG/0.5ML PO SOLN
5.0000 mg | ORAL | Status: DC | PRN
  Administered 2017-11-18: 5 mg via SUBLINGUAL
  Filled 2017-11-17: qty 1

## 2017-11-17 MED ORDER — LORAZEPAM 1 MG PO TABS: 1.0000 mg | ORAL_TABLET | ORAL | Status: DC | PRN

## 2017-11-17 MED ORDER — DEXMEDETOMIDINE HCL IN NACL 400 MCG/100ML IV SOLN
0.4000 ug/kg/h | INTRAVENOUS | Status: DC
  Administered 2017-11-17: 0.5 ug/kg/h via INTRAVENOUS
  Filled 2017-11-17: qty 100

## 2017-11-17 MED ORDER — DEXTROSE 5 % IV SOLN
1000.0000 mg | Freq: Three times a day (TID) | INTRAVENOUS | Status: DC
  Filled 2017-11-17 (×2): qty 10

## 2017-11-17 MED ORDER — ORAL CARE MOUTH RINSE
15.0000 mL | Freq: Two times a day (BID) | OROMUCOSAL | Status: DC
  Administered 2017-11-17 – 2017-11-18 (×3): 15 mL via OROMUCOSAL

## 2017-11-17 MED ORDER — GLYCOPYRROLATE 1 MG PO TABS: 1.0000 mg | ORAL_TABLET | ORAL | Status: DC | PRN

## 2017-11-17 MED ORDER — ACETAMINOPHEN 325 MG PO TABS: 650.0000 mg | ORAL_TABLET | Freq: Four times a day (QID) | ORAL | Status: DC | PRN

## 2017-11-17 MED ORDER — POLYVINYL ALCOHOL 1.4 % OP SOLN
1.0000 [drp] | Freq: Four times a day (QID) | OPHTHALMIC | Status: DC | PRN
  Filled 2017-11-17: qty 15

## 2017-11-17 MED ORDER — HALOPERIDOL 0.5 MG PO TABS
0.5000 mg | ORAL_TABLET | ORAL | Status: DC | PRN
  Filled 2017-11-17: qty 1

## 2017-11-17 MED ORDER — HALOPERIDOL LACTATE 2 MG/ML PO CONC
0.5000 mg | ORAL | Status: DC | PRN
  Filled 2017-11-17: qty 0.3

## 2017-11-17 MED ORDER — ACETAMINOPHEN 650 MG RE SUPP: 650.0000 mg | Freq: Four times a day (QID) | RECTAL | Status: DC | PRN

## 2017-11-17 MED ORDER — MORPHINE SULFATE (CONCENTRATE) 10 MG/0.5ML PO SOLN: 5.0000 mg | ORAL | Status: DC | PRN

## 2017-11-17 MED ORDER — HALOPERIDOL LACTATE 5 MG/ML IJ SOLN: 0.5000 mg | INTRAMUSCULAR | Status: DC | PRN

## 2017-11-17 MED ORDER — GLYCOPYRROLATE 0.2 MG/ML IJ SOLN: 0.2000 mg | INTRAMUSCULAR | Status: DC | PRN

## 2017-11-17 NOTE — Progress Notes (Signed)
Patient ID: Amanda Jacobs, female   DOB: [birth year], 84 y.o.   MRN: [id number]  [facility] PROGRESS NOTE  Amanda Jacobs YQM:[id number] DOB: 03-12-1931 DOA: 11/09/2017 PCP: Amanda Lame, MD  HPI/Subjective: Patient yesterday became very agitated and started having hypoxia had to be transferred to the ICU and placed on a Precedex drip  Objective: Vitals:   12/02/2017 1200 11/23/2017 1307  BP: (!) 148/45 (!) 139/48  Pulse: 97 91  Resp: 18   Temp:  98.5 F (36.9 C)  SpO2: 95% 94%    Filed Weights   10/30/2017 1454 10/22/2017 1455 11/24/2017 2334  Weight: 125 lb (56.7 kg) 176 lb (79.8 kg) 124 lb 5.4 oz (56.4 kg)    ROS: Unable to provide due to mental status   Exam: Physical Exam  HENT:  Nose: No mucosal edema.  Mouth/Throat: No oropharyngeal exudate or posterior oropharyngeal edema.  Eyes: Conjunctivae, EOM and lids are normal. Pupils are equal, round, and reactive to light.  Neck: No JVD present. Carotid bruit is not present. No edema present. No thyroid mass and no thyromegaly present.  Cardiovascular: S1 normal and S2 normal. Exam reveals no gallop.  No murmur heard. Pulses:      Dorsalis pedis pulses are 2+ on the right side, and 2+ on the left side.  Respiratory: No respiratory distress. She has decreased breath sounds in the right middle field, the right lower field, the left middle field and the left lower field. She has no wheezes. She has no rhonchi. She has no rales.  Coarse breath sounds in the lungs  GI: Soft. Bowel sounds are normal. There is no tenderness.  Musculoskeletal:       Right ankle: She exhibits no swelling.       Left ankle: She exhibits swelling.  Lymphadenopathy:    She has no cervical adenopathy.  Neurological: She is disoriented. No cranial nerve deficit.  Skin: Skin is warm. No rash noted. Nails show no clubbing.  Psychiatric: She has a normal mood and affect.      Data Reviewed: Basic Metabolic Panel: Recent Labs  Lab 10/31/2017 1514  12/09/2017 0513 11/20/2017 0712  NA 141 142 142  K 4.2 4.7 4.4  CL 104 107 106  CO2 _0 GLUCOSE 88 75 142*  BUN 21* 28* 27*  CREATININE 0.85 1.05* 0.93  CALCIUM 9.6 9.4 9.6   Liver Function Tests: Recent Labs  Lab 10/22/2017 1514  AST 21  ALT 10*  ALKPHOS 46  BILITOT 1.3*  PROT 6.8  ALBUMIN 4.0   CBC: Recent Labs  Lab 11/11/2017 1514 11/20/2017 0712  WBC 7.5 7.5  NEUTROABS 4.9 7.0*  HGB 14.0 13.1  HCT 42.8 40.0  MCV 92.7 93.2  PLT 198 177     Studies: Dg Chest Port 1 View  Result Date: 12/11/2017 CLINICAL DATA:  Acute respiratory failure EXAM: PORTABLE CHEST 1 VIEW COMPARISON:  11/15/2017 FINDINGS: Low lung volumes. Borderline cardiomegaly. Aortic atherosclerosis. Bibasilar atelectasis. No pneumonic consolidations. No overt pulmonary edema. Ingested oral contrast is seen in the gastric body and fundus. Degenerate change about both shoulders and dorsal spine. IMPRESSION: Low lung volumes with stable mild cardiomegaly and aortic atherosclerosis. Subsegmental atelectasis is seen at each lung base. Electronically Signed   By: Ashley Royalty M.D.   On: 12/15/2017 23:49   Dg Swallowing Func-speech Pathology  Result Date: 12/13/2017 Objective Swallowing Evaluation: Type of Study: MBS-Modified Barium Swallow Study  Patient Details Name: Amanda Jacobs MRN: [id number] Date  of Birth: 01-21-1931 Today's Date: 11/30/2017 Time: SLP Start Time (ACUTE ONLY): 1415 -SLP Stop Time (ACUTE ONLY): 1500 SLP Time Calculation (min) (ACUTE ONLY): 45 min Past Medical History: Past Medical History: Diagnosis Date . Allergy  . Hypertension  Past Surgical History: Past Surgical History: Procedure Laterality Date . CLOSED REDUCTION AND PERCUTANEOUS PINNING OF HUMERUS FRACTURE  1970  hip HPI: 84 year old female admitted 10/16/2017 with generalized weakness. PMH significant for dementia, HTN, UTI, difficulty swallowing, weight loss. CXR = BLL atelectasis, no active disease.  Subjective: Pt seen in radiology for MBS  Assessment / Plan / Recommendation CHL IP CLINICAL IMPRESSIONS 11/25/2017 Clinical Impression Pt presents with significant pharyngeal dysphagia, characterized by delayed swallow reflex, reduced anterior laryngeal movement, and absent epiglottic inversion, with subsequent penetration to the level of the vocal folds on thin and nectar thick consistencies. Contact with vocal folds resulted in immediate change in respirations, including stridor, as well as belching noises. Vallecular and pyriform residue was also noted, which further increases aspiration risk. Decreased relaxation of the cricopharyngeus is anticipated, but was difficult to visualize. At this time, it is recommended that pt continue to be NPO, including medications. SLP met with family members to discuss results of MBS and answer questions. MD also informed of results. ST will follow acutely for education. Recommend rehab placement given level of independence and function prior to admit.  SLP Visit Diagnosis Dysphagia, pharyngoesophageal phase (R13.14) Impact on safety and function Severe aspiration risk   CHL IP TREATMENT RECOMMENDATION 11/27/2017 Treatment Recommendations Therapy as outlined in treatment plan below   Prognosis 11/23/2017 Prognosis for Safe Diet Advancement Fair Barriers to Reach Goals Cognitive deficits CHL IP DIET RECOMMENDATION 11/30/2017 SLP Diet Recommendations NPO Medication Administration Via alternative means   CHL IP OTHER RECOMMENDATIONS 11/24/2017 Recommended Consults Consider ENT evaluation Oral Care Recommendations Oral care QID Other Recommendations Remove water pitcher;Have oral suction available   CHL IP FOLLOW UP RECOMMENDATIONS 11/25/2017 Follow up Recommendations Skilled Nursing facility;Inpatient Rehab   CHL IP FREQUENCY AND DURATION 11/30/2017 Speech Therapy Frequency (ACUTE ONLY) min 1 x/week Treatment Duration 1 week;2 weeks      CHL IP ORAL PHASE 12/05/2017 Oral Phase WFL  CHL IP PHARYNGEAL PHASE 12/08/2017 Pharyngeal Phase  Impaired Pharyngeal- Nectar Teaspoon Delayed swallow initiation-vallecula;Penetration/Aspiration before swallow;Penetration/Aspiration during swallow;Penetration/Apiration after swallow;Pharyngeal residue - pyriform;Pharyngeal residue - valleculae;Reduced epiglottic inversion;Reduced pharyngeal peristalsis;Reduced airway/laryngeal closure;Reduced anterior laryngeal mobility;Reduced tongue base retraction Pharyngeal Material enters airway, CONTACTS cords and not ejected out Pharyngeal- Thin Teaspoon Delayed swallow initiation-vallecula;Penetration/Aspiration before swallow;Penetration/Aspiration during swallow;Penetration/Apiration after swallow;Pharyngeal residue - pyriform;Pharyngeal residue - valleculae;Reduced anterior laryngeal mobility;Reduced epiglottic inversion;Reduced airway/laryngeal closure;Reduced tongue base retraction Pharyngeal Material enters airway, CONTACTS cords and not ejected out Pharyngeal- Puree Delayed swallow initiation-vallecula;Reduced epiglottic inversion;Reduced anterior laryngeal mobility;Reduced tongue base retraction;Pharyngeal residue - pyriform;Pharyngeal residue - valleculae  CHL IP CERVICAL ESOPHAGEAL PHASE 12/11/2017 Cervical Esophageal Phase Impaired Nectar Teaspoon Reduced cricopharyngeal relaxation Thin Teaspoon Reduced cricopharyngeal relaxation Puree Reduced cricopharyngeal relaxation Celia B. Quentin Ore [facility], [place] Speech Language Pathologist [id number]ock 12/01/2017, 3:06 PM               Scheduled Meds: . budesonide (PULMICORT) nebulizer solution  0.5 mg Nebulization BID  . mouth rinse  15 mL Mouth Rinse BID  . mouth rinse  15 mL Mouth Rinse q12n4p  . sodium chloride flush  3 mL Intravenous Q12H   Continuous Infusions: . sodium chloride      Assessment/Plan:  1. Dysphasia, weight loss, failure to thrive.  This  is related to patient having severe vocal cord paralysis patient's family was presented option for PEG tube and I had decided to pursue this  prior to her being transferred to the ICU, now palliative care evaluation pending 2. Acute hypoxic respiratory failure requiring BiPAP last night suspect this is again related to severe vocal cord paralysis, palliative care evaluation pending likely will benefit from comfort care 3. Generalized weakness.  Physical therapy recommended rehab. 4. UTI with E. coli continue ceftriaxone for now 5. Relative hypotension.  Hold antihypertensive medication 6. Dementia on Aricept.   Code Status:     Code Status Orders  (From admission, onward)        Start     Ordered   10/23/2017 1804  Full code  Continuous     11/11/2017 1803    Code Status History    Date Active Date Inactive Code Status Order ID Comments User Context   This patient has a current code status but no historical code status.     Family Communication: Family at the bedside Disposition Plan: Physical therapy recommended rehab  Time spent: 28 minutes, case discussed with gastroenterology  Posey Pronto, [facility]

## 2017-11-17 NOTE — Progress Notes (Signed)
PT Cancellation Note  Patient Details Name: Amanda Jacobs MRN: [id number] DOB: [birth year]   Cancelled Treatment:    Reason Eval/Treat Not Completed: Other (comment).  Per chart review pt was transferred to the ICU yesterday due to acute hypoxic respiratory failure.  Per protocol, will complete current PT order and will need new PT order once pt medically appropriate.    Encarnacion ChuAshley Abashian PT, DPT 12/14/2017, 8:12 AM

## 2017-11-17 NOTE — Progress Notes (Signed)
eLink Physician-Brief Progress Note Patient Name: Amanda Jacobs DOB: Oct 27, 1931 MRN: [id number]   Date of Service  12/15/2017  HPI/Events of Note  Dementia,dysphagia , tr to ICU for acute hypoxic resp failure, from 2L to NRB, now on bipap - clinically favor aspiration although PE in DD  eICU Interventions  Ct Bipap Haldol prn Address Advance directives     Intervention Category Evaluation Type: New Patient Evaluation  Sahith Nurse V. Arna Luis 11/17/2017, 12:32 AM

## 2017-11-17 NOTE — Progress Notes (Signed)
Assigned Patient Nurse, NeurosurgeonAdministrative Coordinator, ICU Charge Nurse, 1C Charge Nurse, Respiratory Therapist at bedside. MD notified. Decision made to transfer to ICU

## 2017-11-17 NOTE — Progress Notes (Signed)
Patient continues to have increased work of breathing and decreased O2 sats. Patient placed on a nonrebreather. MD notified. Rapid Response called.

## 2017-11-17 NOTE — Progress Notes (Signed)
Patient with 50ml of urine this shift, bladder scan revealed . Patient resting in bed and denies urgency or discomfort at this time. APP made aware, will continue to monitor.

## 2017-11-17 NOTE — Progress Notes (Signed)
The patient has been made comfort care and the family does not want the PEG tube placed any longer. I will sign off.  Please call if any further GI concerns or questions.  We would like to thank you for the opportunity to participate in the care of Amanda Jacobs.

## 2017-11-17 NOTE — Progress Notes (Signed)
Patient with increased agitation and restlessness with multiple attempts to pull of bipap and get out of bed. Prn haldol given as order with no effectiveness or change in patient condition. APP made aware and new orders placed. Precedex drip initiated. Patient now resting in bed, VSS, and bed alarm on. Will continue to monitor.

## 2017-11-17 NOTE — Anesthesia Postprocedure Evaluation (Signed)
Anesthesia Post Note  Patient: Amanda Jacobs  Procedure(s) Performed: ESOPHAGOGASTRODUODENOSCOPY (EGD) WITH PROPOFOL (N/A )  Patient location during evaluation: PACU Anesthesia Type: General Level of consciousness: awake and alert Pain management: pain level controlled Vital Signs Assessment: post-procedure vital signs reviewed and stable Respiratory status: spontaneous breathing, nonlabored ventilation, respiratory function stable and patient connected to nasal cannula oxygen Cardiovascular status: blood pressure returned to baseline and stable Postop Assessment: no apparent nausea or vomiting Anesthetic complications: no     Last Vitals:  Vitals:   July 16, 2018 1200 July 16, 2018 1307  BP: (!) 148/45 (!) 139/48  Pulse: 97 91  Resp: 18   Temp:  36.9 C  SpO2: 95% 94%    Last Pain:  Vitals:   July 16, 2018 1639  TempSrc:   PainSc: 0-No pain                 Yevette EdwardsJames G Adams

## 2017-11-17 NOTE — Progress Notes (Signed)
Patient family (daughter Lynden AngVicky) notified of transfer to ICU room 6

## 2017-11-17 NOTE — Progress Notes (Addendum)
SLP Cancellation Note  Patient Details Name: Amanda Jacobs MRN: [id number] DOB: May 05, 1931   Cancelled treatment:       Reason Eval/Treat Not Completed: Other (comment)(orders cancelled - pt now comfort care). Discussed with MD and RN.  SLP available if needs/questions arise. ST signing off.  Celia B. Murvin NatalBueche, [facility] HealthcareMSP, CCC-SLP Speech Language Pathologist [phone]  [address] AuroraBueche, Celia Brown 11/27/2017, 12:47 PM

## 2017-11-17 NOTE — Clinical Social Work Note (Signed)
CSW received referral that patient's family have decided to go to the hospice facility.  CSW spoke to patient's sister Wille CelesteJanie who was at bedside, and [facility] [address]  CSW contacted [facility] [place] nurse liaison who said there is a wait list.  Nurse liaison will speak with family.  CSW to continue to follow patient's progress throughout discharge planning.  Ervin KnackEric R. Sidni Fusco, MSW, Theresia MajorsLCSWA [phone]  11/21/2017 2:02 PM

## 2017-11-17 NOTE — Consult Note (Signed)
Consultation Note Date: 12/08/2017   Patient Name: Amanda Jacobs  DOB: [birth year]  MRN: [id number]  Age / Sex: 84 y.o., female  PCP: Midge Minium, MD Referring Physician: [name], MD  Reason for Consultation: Establishing goals of care  HPI/Patient Profile: This is an 84 yo female with a PMH of HTN,  Dementia.  She presented to [facility] ER 12/31 with generalized weakness, difficulty swallowing, poor po intake, and foul smelling urine onset of symptoms a few days prior to presentation to the ER. Evaluated by SLP and per their note, delayed swallow reflex, reduced anterior laryngeal movement, and absent epiglottic inversion, with subsequent penetration to the level of the vocal folds on thin and nectar thick consistencies. Contact with vocal folds resulted in immediate change in respirations, including stridor, as well as belching noises. Vallecular and pyriform residue was also noted, which further increases aspiration risk. Family does not want PEG placement.     Clinical Assessment and Goals of Care: Ms. Clarida is resting in bed. She states she is married, and has been since she was 84 years old, she told him she was 18. They have 1 daughter. She has several sisters and brothers. She and her husband live alone. She asked where her dentures were several times, she has dementia.   She has lost 60 pounds in 6 months, her appetite has deminished. Her health has declined.   Her family states they felt very rushed with making the decision regarding a PEG tube. Upon talking with Ms. Mabus, she states food is important, she loves to eat. PEG tube was discussed. She states she does not know, when asked if she would want a feeding tube, and also to the questions regarding code status. She states her husband and daughter help her make decisions.   Family meeting at 11:00, husband, daughter, grandson, and other  family members present.  We discussed diagnosis, prognosis, GOC, EOL wishes disposition and options.  A detailed discussion was had today regarding advanced directives.  Concepts specific to code status, artifical feeding and hydration, continued IV antibiotics and rehospitalization was had.  The difference between an aggressive medical intervention path  and a hospice comfort care path for this patient at this time was discussed.  Values and goals of care important to patient and family were attempted to be elicited.  Natural trajectory and expectations at EOL were discussed.  Questions and concerns addressed.    Family feels that patient would not want the feeding tube placed. They do not want her to suffer. They would like hospice with comfort care, but state she will not be able to go home with hospice, as the patient nor her husband would want anyone coming into the home, and they would not have night time support. They would like inpatient hospice placement.   MOST form completed: comfort care no abx.       SUMMARY OF RECOMMENDATIONS    Inpatient hospice with comfort care.    Code Status/Advance Care Planning:  DNR  Symptom Management:   EOL order set in place.   Palliative Prophylaxis:   Oral Care  Additional Recommendations (Limitations, Scope, Preferences):  Full Comfort Care and Initiate Comfort Feeding   Prognosis:   < 2 weeks No oral intake. Difficulty swallowing saliva. Does not want PEG placement. UTI, atelectasis: BIPAP overnight per EMR, due to acute hypoxic respiratory failure likely due secondary to aspiration. Dementia.   Discharge Planning: Hospice facility      Primary Diagnoses: Present on Admission: . UTI (urinary tract infection)   I have reviewed the medical record, interviewed the patient and family, and examined the patient. The following aspects are pertinent.  Past Medical History:  Diagnosis Date  . Allergy   . Hypertension     Social History   Socioeconomic History  . Marital status: Married    Spouse name: None  . Number of children: None  . Years of education: None  . Highest education level: None  Social Needs  . Financial resource strain: None  . Food insecurity - worry: None  . Food insecurity - inability: None  . Transportation needs - medical: None  . Transportation needs - non-medical: None  Occupational History  . None  Tobacco Use  . Smoking status: Never Smoker  . Smokeless tobacco: Never Used  Substance and Sexual Activity  . Alcohol use: No    Alcohol/week: 0.0 oz  . Drug use: No  . Sexual activity: No  Other Topics Concern  . None  Social History Narrative  . None   Family History  Problem Relation Age of Onset  . Hypertension Sister   . Diabetes Sister   . Hypertension Brother   . Heart disease Brother   . Cancer Maternal Aunt        colon  . Diabetes Maternal Grandmother    Scheduled Meds: . budesonide (PULMICORT) nebulizer solution  0.5 mg Nebulization BID  . donepezil  5 mg Oral QHS  . enoxaparin (LOVENOX) injection  40 mg Subcutaneous Q24H  . ipratropium-albuterol  3 mL Nebulization BID  . mouth rinse  15 mL Mouth Rinse BID  . mouth rinse  15 mL Mouth Rinse q12n4p  . methylPREDNISolone (SOLU-MEDROL) injection  40 mg Intravenous Daily  . pantoprazole (PROTONIX) IV  40 mg Intravenous Q12H  . QUEtiapine  25 mg Oral QHS   Continuous Infusions: . ceFAZolin (ANCEF) IVPB - custom     PRN Meds:.acetaminophen **OR** acetaminophen, haloperidol lactate, LORazepam, ondansetron (ZOFRAN) IV, polyethylene glycol Medications Prior to Admission:  Prior to Admission medications   Medication Sig Start Date End Date Taking? Authorizing Provider  acetaminophen (TYLENOL) 500 MG tablet Take 500 mg by mouth every 6 (six) hours as needed.   Yes [provider]  amLODipine (NORVASC) 10 MG tablet Take 1 tablet (10 mg total) by mouth daily. 08/23/17  Yes Ellyn Hack, MD   aspirin EC 81 MG tablet Take 81 mg by mouth at bedtime.    Yes [provider]  atenolol (TENORMIN) 100 MG tablet Take 1 tablet (100 mg total) by mouth daily. 08/23/17  Yes Velta Addison A, MD  donepezil (ARICEPT) 5 MG tablet TAKE ONE TABLET BY MOUTH AT BEDTIME. 10/17/17  Yes Ellyn Hack, MD  hydrochlorothiazide (HYDRODIURIL) 25 MG tablet Take 1 tablet (25 mg total) by mouth daily. 08/23/17  Yes Ellyn Hack, MD  meloxicam (MOBIC) 7.5 MG tablet TAKE 1 TABLET TWICE A DAY WITH FOOD 08/30/17  Yes Brayton El Asad  A, MD   Allergies  Allergen Reactions  . Ace Inhibitors     Angioedema  . Azor  [Amlodipine-Olmesartan] Swelling  . Benazepril Cough   Review of Systems  HENT: Positive for trouble swallowing and voice change.     Physical Exam  Constitutional: No distress.  HENT:  Hoarse sounding voice.   Pulmonary/Chest: Effort normal.  Neurological: She is alert.  Skin: Skin is warm and dry.    Vital Signs: BP 133/64   Pulse (!) 102   Temp 98.1 F (36.7 C) (Axillary)   Resp (!) 23   Ht 5\' 6"  (1.676 m)   Wt 56.4 kg (124 lb 5.4 oz)   SpO2 95%   BMI 20.07 kg/m  Pain Assessment: PAINAD POSS *See Group Information*: S-Acceptable,Sleep, easy to arouse Pain Score: 0-No pain   SpO2: SpO2: 95 % O2 Device:SpO2: 95 % O2 Flow Rate: .O2 Flow Rate (L/min): 2 L/min  IO: Intake/output summary:   Intake/Output Summary (Last 24 hours) at 12/15/2017 0937 Last data filed at 12/04/2017 0700 Gross per 24 hour  Intake 300 ml  Output 350 ml  Net -50 ml    LBM: Last BM Date: 10/18/2017 Baseline Weight: Weight: 56.7 kg (125 lb) Most recent weight: Weight: 56.4 kg (124 lb 5.4 oz)     Palliative Assessment/Data: 10%     Time In: 11:00, 9:30 Time Out: 12:00, 9:50 Time Total: 80 min Greater than 50%  of this time was spent counseling and coordinating care related to the above assessment and plan.  Signed by: Morton Stallrystal Verneice Caspers, NP   Please contact Palliative Medicine Team  phone at [phone] for questions and concerns.  For individual provider: See Loretha StaplerAmion

## 2017-11-22 ENCOUNTER — Ambulatory Visit: Payer: Medicare Other | Admitting: Family Medicine

## 2017-12-16 NOTE — Clinical Social Work Note (Signed)
CSW was informed that patient is not stable for transfer to hospice facility, CSW to sign off.  Ervin KnackEric R. Johany Hansman, MSW, Theresia MajorsLCSWA [phone]  12/14/2017 3:09 PM

## 2017-12-16 NOTE — Progress Notes (Signed)
Daily Progress Note   Patient Name: STEPHENIA Jacobs       Date: 12/13/2017 DOB: 02-24-1931  Age: 84 y.o. MRN#: [id number] Attending Physician: [name], MD Primary Care Physician: Midge Minium, MD Admit Date: 10/23/2017  Reason for Consultation/Follow-up: Terminal Care  Subjective: Amanda Jacobs is resting in bed with eyes closed. Family at bedside. Family states she has opened her eyes a few times and spoke to her husband. Patient shows no signs of distress.   Length of Stay: 3  Current Medications: Scheduled Meds:  . mouth rinse  15 mL Mouth Rinse BID  . mouth rinse  15 mL Mouth Rinse q12n4p  . sodium chloride flush  3 mL Intravenous Q12H    Continuous Infusions: . sodium chloride      PRN Meds: sodium chloride, antiseptic oral rinse, [DISCONTINUED] glycopyrrolate **OR** [DISCONTINUED] glycopyrrolate **OR** glycopyrrolate, [DISCONTINUED] haloperidol **OR** haloperidol **OR** haloperidol lactate, [DISCONTINUED] LORazepam **OR** LORazepam **OR** LORazepam, morphine injection, morphine CONCENTRATE **OR** morphine CONCENTRATE, ondansetron (ZOFRAN) IV, ondansetron **OR** ondansetron (ZOFRAN) IV, polyvinyl alcohol, sodium chloride flush  Physical Exam  Constitutional: No distress.  Pulmonary/Chest:  Unlabored respirations.   Skin: Skin is warm and dry.            Vital Signs: BP (!) 187/83 (BP Location: Left Arm)   Pulse (!) 130   Temp 97.7 F (36.5 C) (Axillary)   Resp 17   Ht 5\' 6"  (1.676 m)   Wt 56.4 kg (124 lb 5.4 oz)   SpO2 (!) 63%   BMI 20.07 kg/m  SpO2: SpO2: (!) 63 % O2 Device: O2 Device: Not Delivered O2 Flow Rate: O2 Flow Rate (L/min): 2 L/min  Intake/output summary:   Intake/Output Summary (Last 24 hours) at 12/15/2017 1048 Last data filed at 11/28/2017  4098 Gross per 24 hour  Intake 0 ml  Output 250 ml  Net -250 ml   LBM: Last BM Date: (pta) Baseline Weight: Weight: 56.7 kg (125 lb) Most recent weight: Weight: 56.4 kg (124 lb 5.4 oz)       Palliative Assessment/Data: 10%    Flowsheet Rows     Most Recent Value  Intake Tab  Referral Department  Hospitalist  Unit at Time of Referral  ICU  Palliative Care Primary Diagnosis  Neurology  Date Notified  12/08/2017  Palliative Care Type  New Palliative care  Reason for referral  Clarify Goals of Care  Date of Admission  11/13/2017  Date first seen by Palliative Care  12/09/2017  # of days Palliative referral response time  0 Day(s)  # of days IP prior to Palliative referral  3  Clinical Assessment  Psychosocial & Spiritual Assessment  Palliative Care Outcomes      Patient Active Problem List   Diagnosis Date Noted  . Dysphagia 11/15/2017  . UTI (urinary tract infection) 10/22/2017  . Medicare annual wellness visit, subsequent 01/31/2017  . Screening for colon cancer 01/31/2017  . Screening for breast cancer 01/31/2017  . Post-menopausal 01/31/2017  . ERRONEOUS ENCOUNTER--DISREGARD 10/07/2015  . Hypertension 07/01/2015  . Dyslipidemia 07/01/2015  . Absence of bladder continence 07/01/2015  . Pelvic relaxation due to vaginal prolapse 03/10/2015    Palliative Care Assessment & Plan   Patient Profile: This is an 84 yo female with a PMH of HTN,  Dementia. [facility] with generalized weakness, difficulty swallowing, poor po intake, and foul smelling urine onset of symptoms a few days prior to presentation to the ER. Evaluated by SLP and per their note, delayed swallow reflex, reduced anterior laryngeal movement, and absent epiglottic inversion, with subsequent penetration to the level of the vocal folds on thin and nectar thick consistencies. Contact with vocal folds resulted in immediate change in respirations, including stridor, as well as belching noises.  Vallecular and pyriform residue was also noted, which further increases aspiration risk. Family does not want PEG placement.    Assessment/Recommendations/Plan:  Amanda Jacobs is showing no signs of distress.   Awaiting hospice facility.   Goals of Care and Additional Recommendations:  Limitations on Scope of Treatment: Full Comfort Care  Code Status:    Code Status Orders  (From admission, onward)        Start     Ordered   11/22/2017 1205  Do not attempt resuscitation (DNR)  Continuous    Question Answer Comment  In the event of cardiac or respiratory ARREST Do not call a "code blue"   In the event of cardiac or respiratory ARREST Do not perform Intubation, CPR, defibrillation or ACLS   In the event of cardiac or respiratory ARREST Use medication by any route, position, wound care, and other measures to relive pain and suffering. May use oxygen, suction and manual treatment of airway obstruction as needed for comfort.      12/03/2017 1205    Code Status History    Date Active Date Inactive Code Status Order ID Comments User Context   11/21/2017 11:58 11/15/2017 12:05 DNR [id number]  Morton StallGriffin, Tyner Codner, NP Inpatient   10/27/2017 18:04 11/20/2017 11:57 Full Code [id number]  Enedina FinnerPatel, Sona, MD Inpatient       Prognosis:   Poor  Discharge Planning:  Hospice facility  Care plan was discussed with Dr. Allena KatzPatel  Thank you for allowing the Palliative Medicine Team to assist in the care of this patient.   Total Time 35 min Prolonged Time Billed NO      Greater than 50%  of this time was spent counseling and coordinating care related to the above assessment and plan.  Morton Stallrystal Avri Paiva, NP  Please contact Palliative Medicine Team phone at [phone] for questions and concerns.

## 2017-12-16 NOTE — Plan of Care (Signed)
  Progressing Education: Knowledge of General Education information will improve 12/02/2017 0810 - Progressing by Luretha MurphyMiles, Kashlynn Kundert, RN Clinical Measurements: Will remain free from infection 11/20/2017 0810 - Progressing by Luretha MurphyMiles, Conchetta Lamia, RN Diagnostic test results will improve 12/09/2017 0810 - Progressing by Luretha MurphyMiles, Hulon Ferron, RN Respiratory complications will improve 11/21/2017 0810 - Progressing by Luretha MurphyMiles, Elhadj Girton, RN Coping: Level of anxiety will decrease 11/28/2017 0810 - Progressing by Luretha MurphyMiles, Adianna Darwin, RN Elimination: Will not experience complications related to urinary retention 12/09/2017 0810 - Progressing by Luretha MurphyMiles, Vedansh Kerstetter, RN Pain Managment: General experience of comfort will improve 11/17/2017 0810 - Progressing by Luretha MurphyMiles, Percival Glasheen, RN Safety: Ability to remain free from injury will improve 11/17/2017 0810 - Progressing by Luretha MurphyMiles, Hurman Ketelsen, RN Skin Integrity: Risk for impaired skin integrity will decrease 11/30/2017 0810 - Progressing by Luretha MurphyMiles, Biridiana Twardowski, RN

## 2017-12-16 NOTE — Death Summary Note (Signed)
[facility] - [place] at [facility] Date of Admission: 11/06/2017  2:42 PM  Date of death:   Admitting diagnosis: *     Diagnosis at time of death* 1.  Acute hypoxic respiratory failure 2.  Dysphagia 3.  Vocal cord paralysis 4.  Generalized weakness 5.  UTI due to E. Coli 6.  Hypotension 7.  Dementia    Hospital course Amanda Jacobs  is a 84 y.o. female with a known history of new onset diagnosis of dementia, hypertension comes to the emergency room brought in by family for patient being weak difficulty swallowing for last few days and foul-smelling urine.  Workup in the ER showed patient has UTI.  Patient was admitted for further evaluation and therapy.  She continued to have significant dysphasia.  Therefore was seen by GI and underwent endoscopy.  Endoscopy showed no obstruction or any stenosis.  Patient was seen by speech therapy and was noted to have a significant vocal cord paralysis leading to dysphasia and difficulty with swallowing.  Subsequently she developed respiratory failure requiring transfer to the ICU.  Discussions were held with the family and she was made comfort care.  Patient passed away            TOTAL TIME TAKING CARE OF THIS PATIENT: 35minutes.    Auburn BilberryPATEL, Amanda Sparlin M.D on 11/19/2017 at 8:27 AM  Between 7am to 6pm - Pager - [phone]  After 6pm go to [email or web address] - password [email or web address] [facility]  [facility]  Office  [phone]  CC: Primary care physician; Midge MiniumWohl, Darren, MD

## 2017-12-16 NOTE — Progress Notes (Signed)
MD notified of patient demise. Family and Chaplain at bedside

## 2017-12-16 NOTE — Progress Notes (Signed)
Writer met with patient's daughter Olegario Shearer, husband and brother in law to offer bed at the hospice home. After further discussion with the remainder of the family they have declined to move her to the hospice home. Hospital care team updated. Referral updated. Flo Shanks RN, BSN, [place] [facility] [place], [facility] [phone]

## 2017-12-16 NOTE — Progress Notes (Signed)
Pt without heartbeat or respirations

## 2017-12-16 NOTE — Progress Notes (Signed)
[facility] notified of patient demise. Ref# [id number]. Spoke with Jonetta OsgoodJamie Lane. Patient can be released to funeral home.

## 2017-12-16 NOTE — Progress Notes (Signed)
Patient expired at 2214. Family at bedside

## 2017-12-16 NOTE — Progress Notes (Signed)
Patient ID: Amanda Jacobs, female   DOB: [birth year], 84 y.o.   MRN: [id number]  [employer] PROGRESS NOTE  Amanda Jacobs WUJ:[id number] DOB: [birth year] DOA: 2017/12/03 PCP: Midge Minium, MD  HPI/Subjective: Patient made comfort care Currently resting well Objective: Vitals:   12/05/2017 1817 12/05/2017 0645  BP:  (!) 187/83  Pulse:  (!) 130  Resp:  17  Temp: 97.7 F (36.5 C) 97.7 F (36.5 C)  SpO2:  (!) 63%    Filed Weights   2017-12-03 1454 Dec 03, 2017 1455 11/22/2017 2334  Weight: 125 lb (56.7 kg) 176 lb (79.8 kg) 124 lb 5.4 oz (56.4 kg)    ROS: Unable to provide due to mental status   Exam: Physical Exam  HENT:  Nose: No mucosal edema.  Mouth/Throat: No oropharyngeal exudate or posterior oropharyngeal edema.  Eyes: Conjunctivae, EOM and lids are normal. Pupils are equal, round, and reactive to light.  Neck: No JVD present. Carotid bruit is not present. No edema present. No thyroid mass and no thyromegaly present.  Cardiovascular: S1 normal and S2 normal. Exam reveals no gallop.  No murmur heard. Pulses:      Dorsalis pedis pulses are 2+ on the right side, and 2+ on the left side.  Respiratory: No respiratory distress. She has decreased breath sounds in the right middle field, the right lower field, the left middle field and the left lower field. She has no wheezes. She has no rhonchi. She has no rales.  Coarse breath sounds in the lungs  GI: Soft. Bowel sounds are normal. There is no tenderness.  Musculoskeletal:       Right ankle: She exhibits no swelling.       Left ankle: She exhibits swelling.  Lymphadenopathy:    She has no cervical adenopathy.  Neurological: She is unresponsive. She is not disoriented. No cranial nerve deficit.  Skin: Skin is warm. No rash noted. Nails show no clubbing.  Psychiatric: She has a normal mood and affect.      Data Reviewed: Basic Metabolic Panel: Recent Labs  Lab 12/03/17 1514 12/04/2017 0513 11/26/2017 0712  NA 141 142  142  K 4.2 4.7 4.4  CL 104 107 106  CO2 28 27 24   GLUCOSE 88 75 142*  BUN 21* 28* 27*  CREATININE 0.85 1.05* 0.93  CALCIUM 9.6 9.4 9.6   Liver Function Tests: Recent Labs  Lab 12/03/2017 1514  AST 21  ALT 10*  ALKPHOS 46  BILITOT 1.3*  PROT 6.8  ALBUMIN 4.0   CBC: Recent Labs  Lab 12/03/2017 1514 11/17/17 0712  WBC 7.5 7.5  NEUTROABS 4.9 7.0*  HGB 14.0 13.1  HCT 42.8 40.0  MCV 92.7 93.2  PLT 198 177     Studies: Dg Chest Port 1 View  Result Date: 11/15/2017 CLINICAL DATA:  Acute respiratory failure EXAM: PORTABLE CHEST 1 VIEW COMPARISON:  11/15/2017 FINDINGS: Low lung volumes. Borderline cardiomegaly. Aortic atherosclerosis. Bibasilar atelectasis. No pneumonic consolidations. No overt pulmonary edema. Ingested oral contrast is seen in the gastric body and fundus. Degenerate change about both shoulders and dorsal spine. IMPRESSION: Low lung volumes with stable mild cardiomegaly and aortic atherosclerosis. Subsegmental atelectasis is seen at each lung base. Electronically Signed   By: Tollie Eth M.D.   On: 12/13/2017 23:49    Scheduled Meds: . mouth rinse  15 mL Mouth Rinse BID  . mouth rinse  15 mL Mouth Rinse q12n4p  . sodium chloride flush  3 mL Intravenous Q12H   Continuous Infusions: .  sodium chloride      Assessment/Plan:  1. Dysphasia, weight loss, failure to thrive.  This is related to patient having severe vocal cord paralysis now comfort care,  2. Acute hypoxic respiratory failure requiring BiPAP comfort care and supportive care 3. Generalized weakness.  Physical therapy recommended rehab. 4. UTI with E. coli comfort care now 5. Relative hypotension.  Comfort care 6. Dementia on Aricept.   Code Status:     Code Status Orders  (From admission, onward)        Start     Ordered   11/08/2017 1804  Full code  Continuous     11/03/2017 1803    Code Status History    Date Active Date Inactive Code Status Order ID Comments User Context   This patient  has a current code status but no historical code status.     Family Communication: Family at the bedside Disposition Plan: Comfort care Time spent: 28 minutes, case discussed with husband and niece at bedside Amanda Jacobs, [facility]  [employer] [facility]

## 2017-12-16 NOTE — Progress Notes (Signed)
CH responded to a PG for End of Life. Pt had passed upon my arrival. Multiple family members are present. CH spent time with family who spoke of the Pt and the lives she has touched. Pt and husband were married for 70 years. Family is spiritually strong and has good support. Two pastors were with the Pt shortly before she passed. CH provided a pastoral presence and prayer.    2018/06/18 2300  Clinical Encounter Type  Visited With Family;Health care provider  Visit Type Initial;Spiritual support;Death  Referral From Nurse

## 2017-12-16 DEATH — deceased

## 2018-04-05 IMAGING — CT CT HEAD W/O CM
2 series · 15 of 37 positions shown, 18 images · non-contrast
Comparison: None.

CLINICAL DATA: Normal pressure hydrocephalus. Progressive memory
loss. Unsteady gait and balance.

EXAM:
CT HEAD WITHOUT CONTRAST
TECHNIQUE: Contiguous axial images were obtained from the base of the skull
through the vertex without intravenous contrast.

[Series 2: head wo · axial · 0.39mm/px · z∈[-110,-0]mm · 12 of 27 slices shown, 15 images]
[im 3/27  brain]
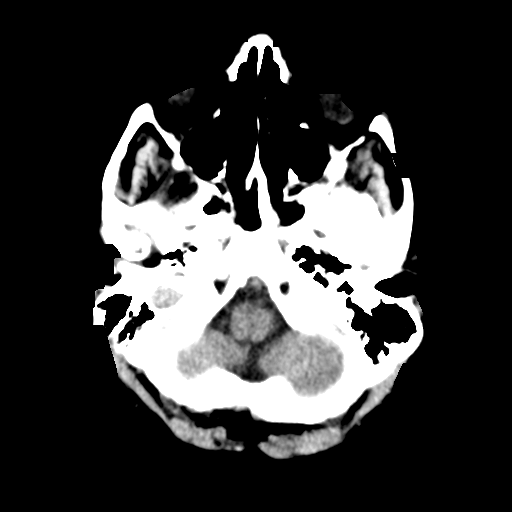
[im 3/27  bone]
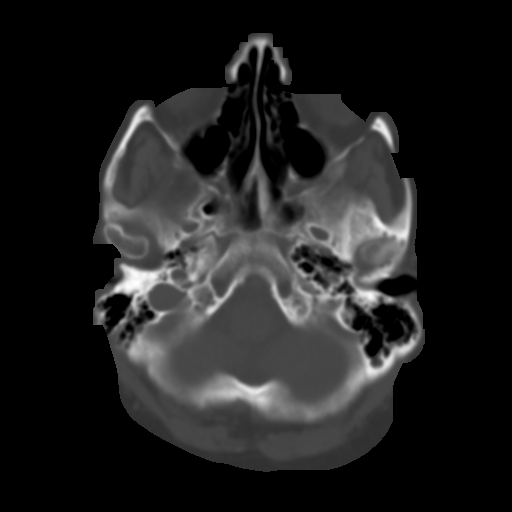
[im 5/27  brain]
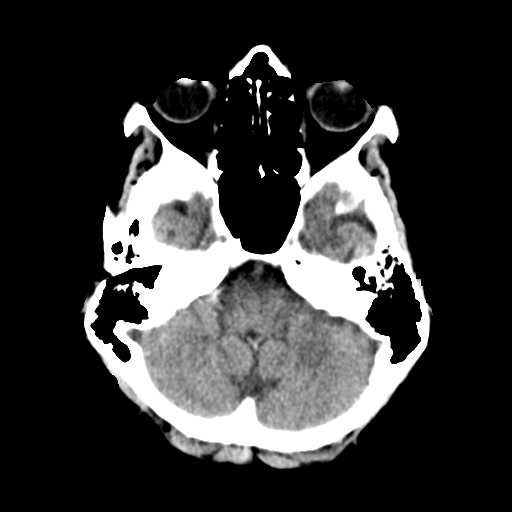
[im 7/27  brain]
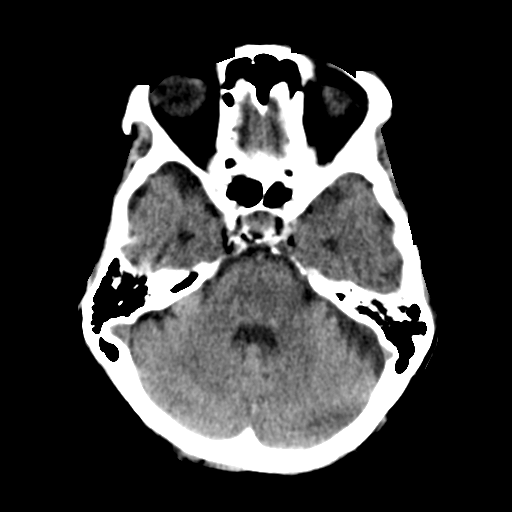
[im 9/27  brain]
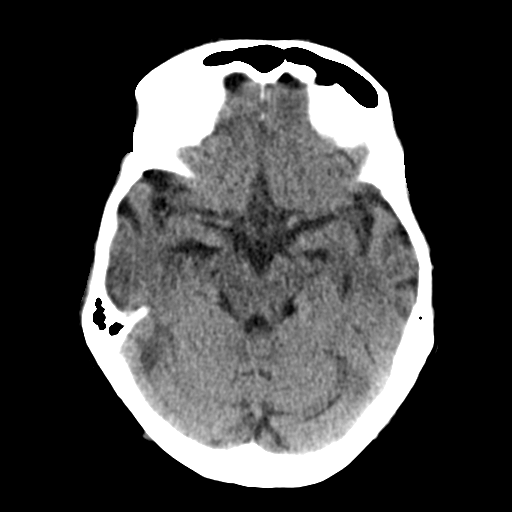
[im 11/27  brain]
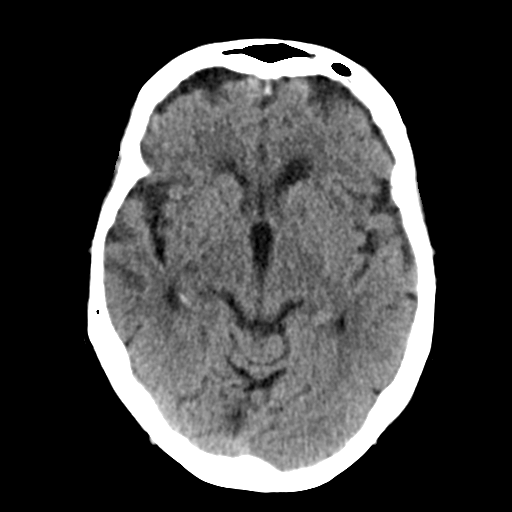
[im 11/27  bone]
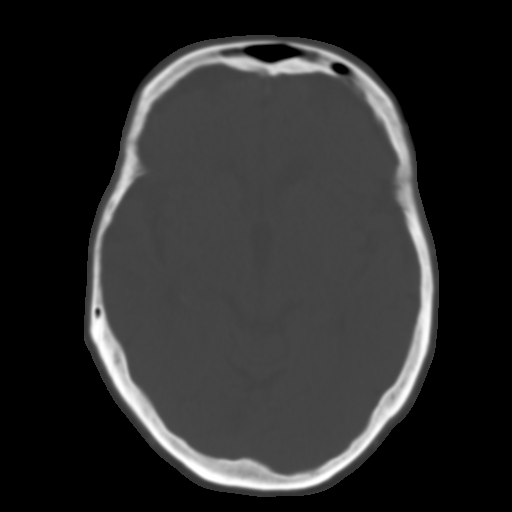
[im 13/27  brain]
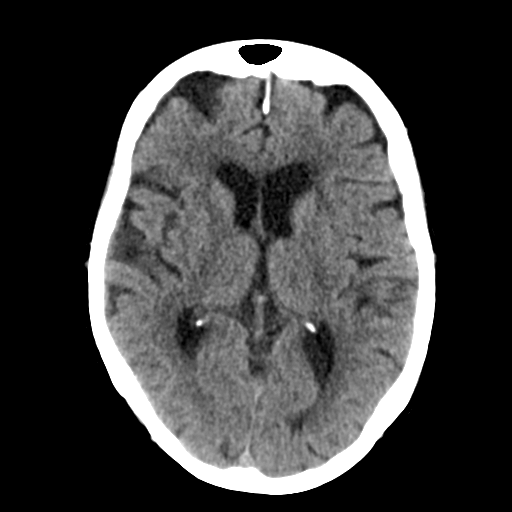
[im 15/27  brain]
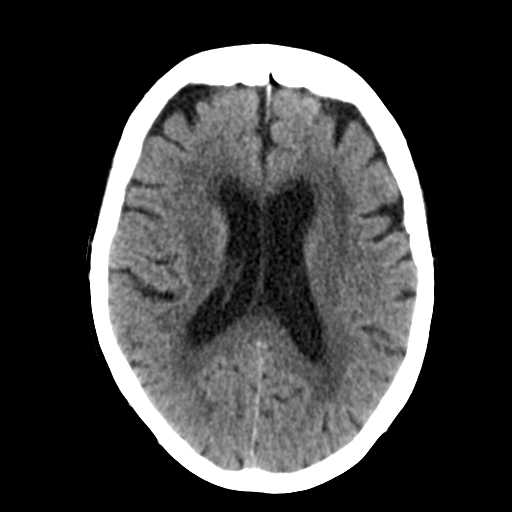
[im 17/27  brain]
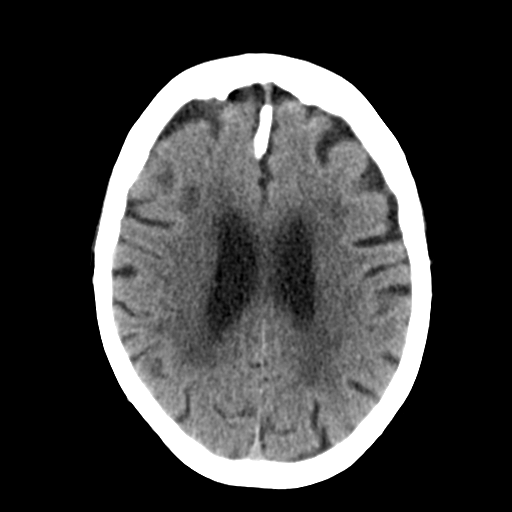
[im 19/27  brain]
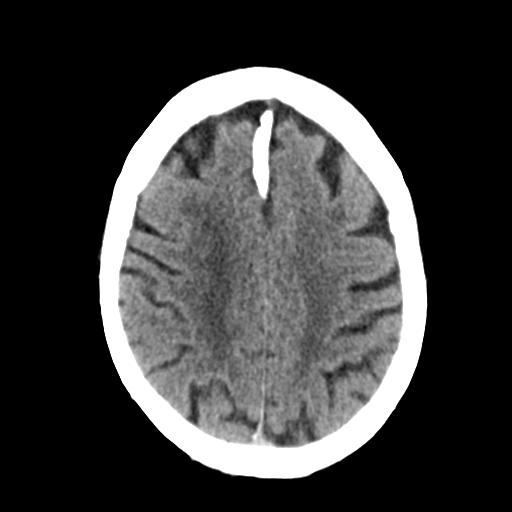
[im 19/27  bone]
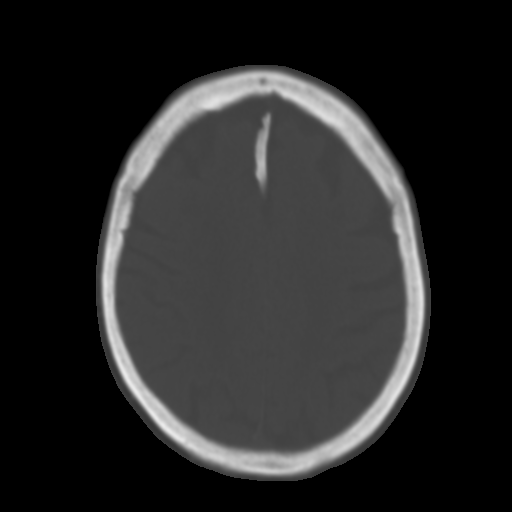
[im 21/27  brain]
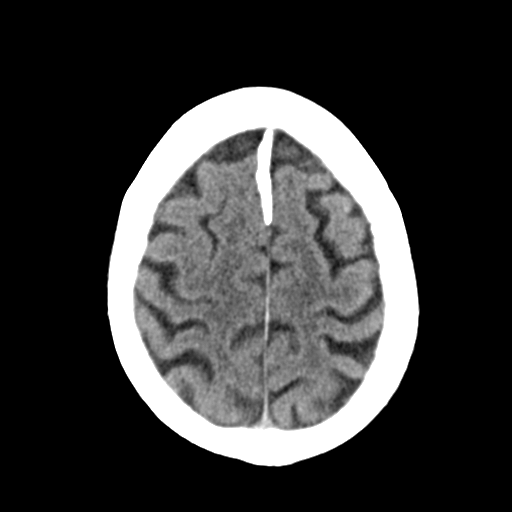
[im 23/27  brain]
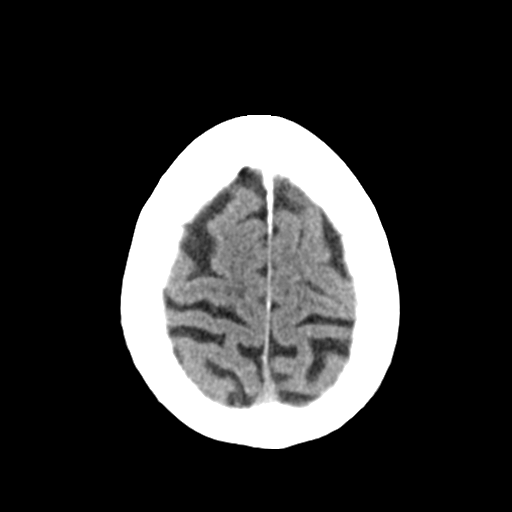
[im 25/27  brain]
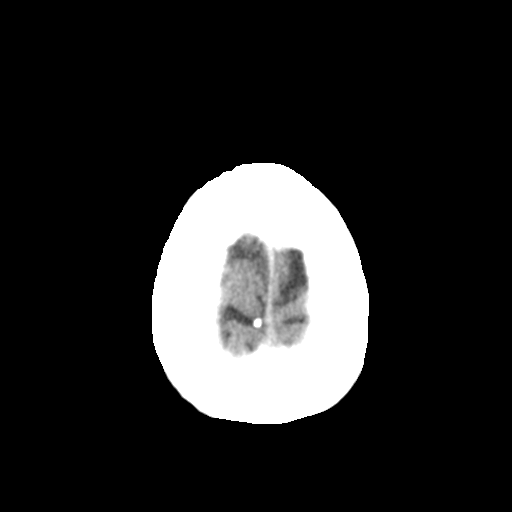

[Series 4: coronal soft tissue · coronal · 0.26mm/px · 3 of 69 slices shown]
[im 23/69  brain]
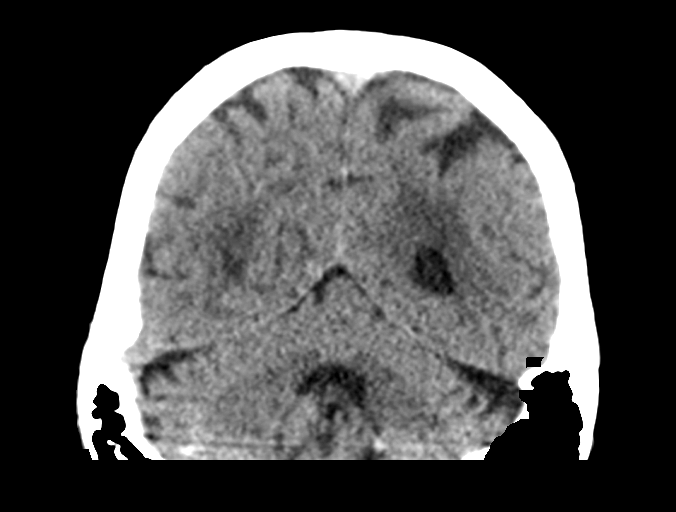
[im 31/69  brain]
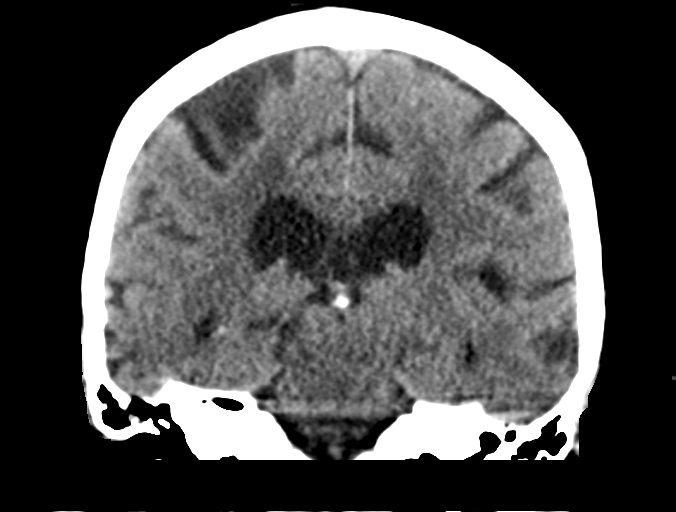
[im 38/69  brain]
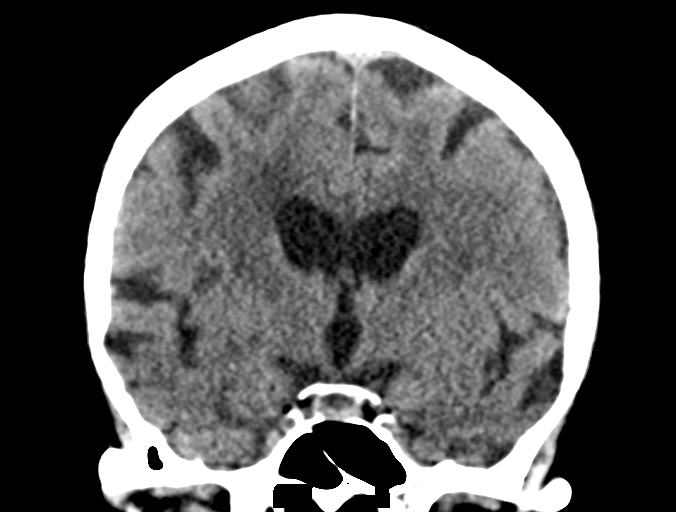

[15 of 37 positions shown; findings below may reference images not displayed]

FINDINGS: Brain: Moderate generalized atrophy and diffuse white matter changes
are present bilaterally. The ventricles are proportionate to the
degree of atrophy without hydrocephalus. No significant extra-axial
fluid collection is present. The brainstem and cerebellum are
normal.

Vascular: Atherosclerotic calcifications are present within the
cavernous internal carotid artery is bilaterally. There is no
hyperdense vessel.

Skull: The calvarium is intact. The paranasal sinuses and mastoid
air cells are clear.

Sinuses/Orbits: The paranasal sinuses mastoid air cells are clear.
Globes and orbits are within normal limits bilaterally.
IMPRESSION: 1. Generalized atrophy and diffuse white matter disease is
moderately advanced for age. This likely reflects the sequela of
chronic microvascular ischemia.
2. No acute intracranial abnormality.
